# Patient Record
Sex: Female | Born: 1940 | Race: White | Hispanic: No | Marital: Married | Smoking: Former smoker
Health system: Southern US, Community
[De-identification: ages and names within clinical notes are randomized; demographics above are authoritative.]

## PROBLEM LIST (undated history)

## (undated) DIAGNOSIS — I493 Ventricular premature depolarization: Secondary | ICD-10-CM

## (undated) DIAGNOSIS — E785 Hyperlipidemia, unspecified: Secondary | ICD-10-CM

## (undated) DIAGNOSIS — I10 Essential (primary) hypertension: Secondary | ICD-10-CM

## (undated) DIAGNOSIS — I471 Supraventricular tachycardia: Secondary | ICD-10-CM

## (undated) DIAGNOSIS — R002 Palpitations: Secondary | ICD-10-CM

## (undated) DIAGNOSIS — H3530 Unspecified macular degeneration: Secondary | ICD-10-CM

## (undated) DIAGNOSIS — I350 Nonrheumatic aortic (valve) stenosis: Secondary | ICD-10-CM

## (undated) DIAGNOSIS — I499 Cardiac arrhythmia, unspecified: Secondary | ICD-10-CM

## (undated) HISTORY — DX: Ventricular premature depolarization: I49.3

## (undated) HISTORY — PX: TONSILLECTOMY: SUR1361

## (undated) HISTORY — DX: Nonrheumatic aortic (valve) stenosis: I35.0

## (undated) HISTORY — DX: Essential (primary) hypertension: I10

## (undated) HISTORY — DX: Hyperlipidemia, unspecified: E78.5

## (undated) HISTORY — PX: LAPAROSCOPY: SHX197

## (undated) HISTORY — PX: EYE SURGERY: SHX253

## (undated) HISTORY — DX: Cardiac arrhythmia, unspecified: I49.9

## (undated) HISTORY — DX: Palpitations: R00.2

## (undated) HISTORY — DX: Supraventricular tachycardia: I47.1

## (undated) HISTORY — PX: CHOLECYSTECTOMY: SHX55

---

## 1998-04-28 ENCOUNTER — Other Ambulatory Visit: Admission: RE | Admit: 1998-04-28 | Discharge: 1998-04-28 | Payer: Self-pay | Admitting: Obstetrics and Gynecology

## 1999-05-05 ENCOUNTER — Other Ambulatory Visit: Admission: RE | Admit: 1999-05-05 | Discharge: 1999-05-05 | Payer: Self-pay | Admitting: Obstetrics and Gynecology

## 2000-04-28 ENCOUNTER — Encounter: Payer: Self-pay | Admitting: Obstetrics and Gynecology

## 2000-04-28 ENCOUNTER — Encounter: Admission: RE | Admit: 2000-04-28 | Discharge: 2000-04-28 | Payer: Self-pay | Admitting: Obstetrics and Gynecology

## 2000-05-03 ENCOUNTER — Other Ambulatory Visit: Admission: RE | Admit: 2000-05-03 | Discharge: 2000-05-03 | Payer: Self-pay | Admitting: Obstetrics and Gynecology

## 2000-06-01 ENCOUNTER — Emergency Department (HOSPITAL_COMMUNITY): Admission: EM | Admit: 2000-06-01 | Discharge: 2000-06-01 | Payer: Self-pay | Admitting: Emergency Medicine

## 2000-06-01 ENCOUNTER — Encounter: Payer: Self-pay | Admitting: Emergency Medicine

## 2000-06-06 ENCOUNTER — Encounter: Payer: Self-pay | Admitting: General Surgery

## 2000-06-07 ENCOUNTER — Observation Stay (HOSPITAL_COMMUNITY): Admission: RE | Admit: 2000-06-07 | Discharge: 2000-06-08 | Payer: Self-pay | Admitting: General Surgery

## 2000-06-07 ENCOUNTER — Encounter (INDEPENDENT_AMBULATORY_CARE_PROVIDER_SITE_OTHER): Payer: Self-pay | Admitting: Specialist

## 2001-05-01 ENCOUNTER — Encounter: Payer: Self-pay | Admitting: Obstetrics and Gynecology

## 2001-05-01 ENCOUNTER — Encounter: Admission: RE | Admit: 2001-05-01 | Discharge: 2001-05-01 | Payer: Self-pay | Admitting: Obstetrics and Gynecology

## 2001-05-03 ENCOUNTER — Other Ambulatory Visit: Admission: RE | Admit: 2001-05-03 | Discharge: 2001-05-03 | Payer: Self-pay | Admitting: Obstetrics and Gynecology

## 2002-05-02 ENCOUNTER — Encounter: Admission: RE | Admit: 2002-05-02 | Discharge: 2002-05-02 | Payer: Self-pay | Admitting: Obstetrics and Gynecology

## 2002-05-02 ENCOUNTER — Encounter: Payer: Self-pay | Admitting: Obstetrics and Gynecology

## 2002-05-07 ENCOUNTER — Other Ambulatory Visit: Admission: RE | Admit: 2002-05-07 | Discharge: 2002-05-07 | Payer: Self-pay | Admitting: Obstetrics and Gynecology

## 2003-05-06 ENCOUNTER — Encounter: Payer: Self-pay | Admitting: Geriatric Medicine

## 2003-05-06 ENCOUNTER — Encounter: Admission: RE | Admit: 2003-05-06 | Discharge: 2003-05-06 | Payer: Self-pay | Admitting: Geriatric Medicine

## 2003-05-16 ENCOUNTER — Other Ambulatory Visit: Admission: RE | Admit: 2003-05-16 | Discharge: 2003-05-16 | Payer: Self-pay | Admitting: Obstetrics and Gynecology

## 2004-05-06 ENCOUNTER — Encounter: Admission: RE | Admit: 2004-05-06 | Discharge: 2004-05-06 | Payer: Self-pay | Admitting: Geriatric Medicine

## 2004-06-16 ENCOUNTER — Other Ambulatory Visit: Admission: RE | Admit: 2004-06-16 | Discharge: 2004-06-16 | Payer: Self-pay | Admitting: Obstetrics and Gynecology

## 2004-10-12 ENCOUNTER — Ambulatory Visit (HOSPITAL_COMMUNITY): Admission: RE | Admit: 2004-10-12 | Discharge: 2004-10-12 | Payer: Self-pay | Admitting: Gastroenterology

## 2005-06-17 ENCOUNTER — Other Ambulatory Visit: Admission: RE | Admit: 2005-06-17 | Discharge: 2005-06-17 | Payer: Self-pay | Admitting: Obstetrics and Gynecology

## 2005-07-02 ENCOUNTER — Encounter: Admission: RE | Admit: 2005-07-02 | Discharge: 2005-07-02 | Payer: Self-pay | Admitting: Obstetrics and Gynecology

## 2006-06-20 ENCOUNTER — Encounter: Admission: RE | Admit: 2006-06-20 | Discharge: 2006-06-20 | Payer: Self-pay | Admitting: Obstetrics and Gynecology

## 2007-06-22 ENCOUNTER — Encounter: Admission: RE | Admit: 2007-06-22 | Discharge: 2007-06-22 | Payer: Self-pay | Admitting: Obstetrics and Gynecology

## 2008-06-24 ENCOUNTER — Encounter: Admission: RE | Admit: 2008-06-24 | Discharge: 2008-06-24 | Payer: Self-pay | Admitting: Obstetrics and Gynecology

## 2009-07-08 ENCOUNTER — Encounter: Admission: RE | Admit: 2009-07-08 | Discharge: 2009-07-08 | Payer: Self-pay | Admitting: Obstetrics and Gynecology

## 2010-07-09 ENCOUNTER — Encounter: Admission: RE | Admit: 2010-07-09 | Discharge: 2010-07-09 | Payer: Self-pay | Admitting: Obstetrics and Gynecology

## 2010-11-29 ENCOUNTER — Encounter: Payer: Self-pay | Admitting: Obstetrics and Gynecology

## 2010-11-30 ENCOUNTER — Encounter: Payer: Self-pay | Admitting: Obstetrics and Gynecology

## 2011-03-05 ENCOUNTER — Other Ambulatory Visit: Payer: Self-pay | Admitting: Geriatric Medicine

## 2011-03-05 DIAGNOSIS — Z1231 Encounter for screening mammogram for malignant neoplasm of breast: Secondary | ICD-10-CM

## 2011-03-10 ENCOUNTER — Emergency Department (HOSPITAL_COMMUNITY): Payer: Medicare Other

## 2011-03-10 ENCOUNTER — Observation Stay (HOSPITAL_COMMUNITY)
Admission: EM | Admit: 2011-03-10 | Discharge: 2011-03-12 | Disposition: A | Discharge status: Home / Self Care | Payer: Medicare Other | Attending: Internal Medicine | Admitting: Internal Medicine

## 2011-03-10 DIAGNOSIS — Z79899 Other long term (current) drug therapy: Secondary | ICD-10-CM | POA: Insufficient documentation

## 2011-03-10 DIAGNOSIS — I10 Essential (primary) hypertension: Secondary | ICD-10-CM | POA: Insufficient documentation

## 2011-03-10 DIAGNOSIS — I700 Atherosclerosis of aorta: Secondary | ICD-10-CM | POA: Insufficient documentation

## 2011-03-10 DIAGNOSIS — R7301 Impaired fasting glucose: Secondary | ICD-10-CM | POA: Insufficient documentation

## 2011-03-10 DIAGNOSIS — E785 Hyperlipidemia, unspecified: Secondary | ICD-10-CM | POA: Insufficient documentation

## 2011-03-10 DIAGNOSIS — R079 Chest pain, unspecified: Principal | ICD-10-CM | POA: Insufficient documentation

## 2011-03-10 DIAGNOSIS — E876 Hypokalemia: Secondary | ICD-10-CM | POA: Insufficient documentation

## 2011-03-10 DIAGNOSIS — Z87891 Personal history of nicotine dependence: Secondary | ICD-10-CM | POA: Insufficient documentation

## 2011-03-10 LAB — BASIC METABOLIC PANEL
BUN: 14 mg/dL (ref 6–23)
CO2: 27 mEq/L (ref 19–32)
Chloride: 103 mEq/L (ref 96–112)
GFR calc non Af Amer: >60 mL/min (ref >60)
Glucose, Bld: 116 mg/dL — ABNORMAL HIGH (ref 70–99)
Potassium: 3.4 mEq/L — ABNORMAL LOW (ref 3.5–5.1)

## 2011-03-10 LAB — DIFFERENTIAL
Eosinophils Absolute: 0.1 10*3/uL (ref 0.0–0.7)
Eosinophils Relative: 1 % (ref 0–5)
Lymphs Abs: 1.9 10*3/uL (ref 0.7–4.0)
Monocytes Absolute: 0.4 10*3/uL (ref 0.1–1.0)

## 2011-03-10 LAB — CBC
MCHC: 34.6 g/dL (ref 30.0–36.0)
MCV: 89.5 fL (ref 78.0–100.0)
Platelets: 297 10*3/uL (ref 150–400)
RDW: 13.1 % (ref 11.5–15.5)
WBC: 9 10*3/uL (ref 4.0–10.5)

## 2011-03-10 LAB — URINALYSIS, ROUTINE W REFLEX MICROSCOPIC
Bilirubin Urine: NEGATIVE
Glucose, UA: NEGATIVE mg/dL
Hgb urine dipstick: NEGATIVE
Specific Gravity, Urine: 1.01 (ref 1.005–1.030)
pH: 6.5 (ref 5.0–8.0)

## 2011-03-10 LAB — POCT CARDIAC MARKERS
CKMB, poc: <1 ng/mL — ABNORMAL LOW (ref 1.0–8.0)
CKMB, poc: 1 ng/mL (ref 1.0–8.0)
Myoglobin, poc: 41.9 ng/mL (ref 12–200)

## 2011-03-11 LAB — COMPREHENSIVE METABOLIC PANEL
ALT: 28 U/L (ref 0–35)
Albumin: 3.7 g/dL (ref 3.5–5.2)
Alkaline Phosphatase: 89 U/L (ref 39–117)
Glucose, Bld: 118 mg/dL — ABNORMAL HIGH (ref 70–99)
Potassium: 3.4 mEq/L — ABNORMAL LOW (ref 3.5–5.1)
Sodium: 141 mEq/L (ref 135–145)
Total Protein: 6.5 g/dL (ref 6.0–8.3)

## 2011-03-11 LAB — CK TOTAL AND CKMB (NOT AT ARMC)
CK, MB: 1.8 ng/mL (ref 0.3–4.0)
CK, MB: 1.8 ng/mL (ref 0.3–4.0)
Relative Index: INVALID (ref 0.0–2.5)
Total CK: 72 U/L (ref 7–177)

## 2011-03-11 LAB — POCT I-STAT 3, VENOUS BLOOD GAS (G3P V)
pCO2, Ven: 40.3 mmHg — ABNORMAL LOW (ref 45.0–50.0)
pH, Ven: 7.34 — ABNORMAL HIGH (ref 7.250–7.300)
pO2, Ven: 37 mmHg (ref 30.0–45.0)

## 2011-03-11 LAB — POCT I-STAT 3, ART BLOOD GAS (G3+)
Bicarbonate: 24 mEq/L (ref 20.0–24.0)
TCO2: 25 mmol/L (ref 0–100)
pCO2 arterial: 39.7 mmHg (ref 35.0–45.0)
pH, Arterial: 7.39 (ref 7.350–7.400)

## 2011-03-11 LAB — TROPONIN I: Troponin I: <0.3 ng/mL (ref <0.30)

## 2011-03-11 LAB — MAGNESIUM: Magnesium: 2.1 mg/dL (ref 1.5–2.5)

## 2011-03-11 LAB — MRSA PCR SCREENING: MRSA by PCR: NEGATIVE

## 2011-03-11 LAB — CBC
HCT: 37.5 % (ref 36.0–46.0)
RDW: 13.3 % (ref 11.5–15.5)

## 2011-03-11 LAB — GLUCOSE, CAPILLARY: Glucose-Capillary: 121 mg/dL — ABNORMAL HIGH (ref 70–99)

## 2011-03-11 LAB — PROTIME-INR: INR: 0.92 (ref 0.00–1.49)

## 2011-03-11 LAB — LIPASE, BLOOD: Lipase: 25 U/L (ref 11–59)

## 2011-03-11 LAB — HEPATIC FUNCTION PANEL
Bilirubin, Direct: 0.1 mg/dL (ref 0.0–0.3)
Indirect Bilirubin: 0.3 mg/dL (ref 0.3–0.9)
Total Protein: 6.7 g/dL (ref 6.0–8.3)

## 2011-03-11 MED ORDER — IOHEXOL 300 MG/ML  SOLN
100.0000 mL | Freq: Once | INTRAMUSCULAR | Status: AC | PRN
  Administered 2011-03-11: 100 mL via INTRAVENOUS

## 2011-03-12 LAB — CBC
HCT: 34.2 % — ABNORMAL LOW (ref 36.0–46.0)
Hemoglobin: 11.5 g/dL — ABNORMAL LOW (ref 12.0–15.0)
MCHC: 33.6 g/dL (ref 30.0–36.0)
MCV: 89.8 fL (ref 78.0–100.0)
RDW: 13.4 % (ref 11.5–15.5)

## 2011-03-12 LAB — BASIC METABOLIC PANEL
BUN: 11 mg/dL (ref 6–23)
CO2: 27 mEq/L (ref 19–32)
Calcium: 9 mg/dL (ref 8.4–10.5)
Glucose, Bld: 125 mg/dL — ABNORMAL HIGH (ref 70–99)
Potassium: 3 mEq/L — ABNORMAL LOW (ref 3.5–5.1)
Sodium: 139 mEq/L (ref 135–145)

## 2011-03-12 LAB — HEMOGLOBIN A1C
Hgb A1c MFr Bld: 5.9 % — ABNORMAL HIGH (ref <5.7)
Mean Plasma Glucose: 123 mg/dL — ABNORMAL HIGH (ref <117)

## 2011-03-12 LAB — LIPID PANEL
HDL: 68 mg/dL (ref >39)
Triglycerides: 106 mg/dL (ref <150)
VLDL: 21 mg/dL (ref 0–40)

## 2011-03-13 NOTE — Discharge Summary (Signed)
[name], SHANDS                 ACCOUNT NO.:  [id number]  MEDICAL RECORD NO.:  [id number]           PATIENT TYPE:  O  LOCATION:  2005                         FACILITY:  [facility]  PHYSICIAN:  Pleas Koch, MD        DATE OF BIRTH:  1941-04-08  DATE OF ADMISSION:  03/10/2011 DATE OF DISCHARGE:                              DISCHARGE SUMMARY   DISCHARGE DIAGNOSES: 1. Chest pain likely a combination of possible costochondritis     secondary to lifting a heavy bag versus reflux. 2. Hypertension, well controlled. 3. Hyperlipidemia with LDL of 74. 4. Hypokalemia. 5. Impaired fasting glucose. 6. Former heavy tobacco use.  DISCHARGE MEDICATIONS: 1. Tylenol 2 tablets q.a.m. 325 mg. 2. Fish oil one cap over the counter daily. 3. Ambien 5 mg 1 tablet nightly. 4. New med, Coreg 3.125 b.i.d. with meals 59-month supply given. 5. Continue amlodipine and benazepril 5/20. 6. Felodipine has been discontinued 10 mg. 7. Simvastatin 20 mg p.o. nightly. 8. Multivitamin over the counter 1 tablet daily. 9. The patient is given a prescription of oxycodone 5/325 daily     p.r.n., 12 tablets. 10.Aspirin 81 mg. 11.calcium carbonate 1 tablet b.i.d. 12.Hydrochlorothiazide 25 one tablet daily. 13.Vitamin D3 p.o. 1 tablet daily.  HOSPITAL COURSE:  Briefly, the patient is a very pleasant 70 year old Caucasian female who presents to [facility] with chest pain. She went to primary care physician who prescribed Celebrex.  Despite taking which, she developed chest pressure, it was nonradiating.  It was noted that the patient had an EKG, which was worrisome as the primary care physician felt she was supposed to be scheduled for Cardiology consult.  Went back home, then developed sudden chest pressure, which was retrosternal, nonradiating, and it was noted be worse with exertion. No shortness breath, no nausea, no vomiting, no diarrhea.  Some radiation to her back.  PHYSICAL EXAMINATION:  VITAL SIGNS:   Blood pressure 179/78, pulse 88, temperature 98.2, respirations are 18. HEART:  Systolic murmur. GENERAL:  Alert and oriented. EXTREMITIES:  Upper and lower extremity with 5/5 power.  EKG initially showed ST-T wave changes.  Heart rate was 92 beats per minute.  WBC was 9, platelets are 297.  D-dimer was less than 0.2. Potassium of 3.4, BUN/creatinine ratio is 14/0.4.  First set of cardiac markers is negative.  PROBLEMS: 1. Chest pain.  The patient was kept on the Step-Down Unit initially     and seen by Dr. Verdis Prime of Cardiology.  The patient was kept on     IV nitro drip initially as Nitropaste dropped her pressure     slightly.  This ultimately resolved, and she did not need nitro     subsequently.  However, the patient underwent cardiac     catheterization, results of which are below.  Hemodynamic data     showed widely patent coronary arteries around to 20-30% narrowing     of proximal RCA.  Normal left ventricular systolic function, mild     aortic stenosis with valve area greater than 1.21 sq cm.  It was     thought  that no further cardiac cause could be evaluated.  It was     recommended to continue statin and aspirin therapy.  The patient     had an echocardiogram showing EF of 65-70% with possible bicuspid     AV valve and mild stenosis, mild regurgitation of the AV valve.     The patient will be followed up by Dr. Verdis Prime as per his     preference on discharge.  I am going to discharge the patient only     when seen by Cardiology.  They can make that call if the patient     needs to stay. 2. Impaired fasting glucose.  The patient states to me that she has     had slightly higher blood sugars than usual over 100.  I did get an     A1c, but that is pending, that will need followup in the outpatient     setting. 3. Lipid panel done in hospital showed LDL to be 74, which is     excellent.  She can continue on a dose of simvastatin. 4. Hypertension.  The patient will  continue on her lisinopril 10 and     also her   Dictation ended at this point.          ______________________________ Pleas Koch, MD     JS/MEDQ  D:  03/12/2011  T:  03/12/2011  Job:  [id number]  Electronically Signed by Pleas Koch MD on 03/13/2011 05:28:59 PM

## 2011-03-18 NOTE — Consult Note (Signed)
Whitehead, [name]                 ACCOUNT NO.:  [id number]  MEDICAL RECORD NO.:  [id number]           PATIENT TYPE:  LOCATION:                                 FACILITY:  PHYSICIAN:  Lyn Records, M.D.   DATE OF BIRTH:  Jan 31, 1941  DATE OF CONSULTATION: DATE OF DISCHARGE:                                CONSULTATION   REASON FOR CONSULTATION:  Chest discomfort.  CONCLUSIONS: 1. Prolonged precordial chest tightness, continuing through the night     despite IV nitroglycerin.  Cause is uncertain, but we must exclude     unstable angina pectoris.     a.     Coronary artery disease has been documented by CT angio      performed last night with heavy calcification in coronary      territories. 2. Aortic stenosis.     a.     Mild aortic stenosis by echo in September 2011 with peak      transaortic velocity of 2.6 meters/second and calculated aortic      valve area of 1.3 sq cm.     b.     Left ventricular ejection fraction 65%. 3. Hypertension. 4. Hyperlipidemia. 5. Former heavy cigarette smoker.  RECOMMENDATIONS: 1. Aspirin. 2. IV nitro 3. Statin therapy. 4. Left and right heart cath.  The procedure, the pros and cons, and     especially the risks were discussed with the patient in detail.     Risks include stroke, heart attack, death, bleeding,     nephrotoxicity, vascular ischemia, and among others were discussed     and accepted by the patient.  We also discussed the possibility of     PCI with stenting if appropriate and the risk to include the     possibility of emergency surgery.  COMMENTS:  The patient is 70 years of age and for approximately a week has had some vague chest discomfort.  This was initially felt to be costochondritis.  She had a complete exam yesterday with Dr. Merlene Laughter and again complained to him of the intermittent nature of the pain.  He became concerned and felt that she needed to have a nuclear perfusion study performed.  Later in the day on  yesterday, the discomfort became quite severe and was described as a pressure or tightness in the chest.  It was not made worse by physical activity. There was no dyspnea or diaphoresis.  Because of the severity of discomfort, she came to the emergency room where she was seen and admitted by the [facility].  She was given nitroglycerin paste and pain medication in the emergency room that caused hypotension, nausea, and vomiting.  She was ultimately admitted to the hospital, started on IV nitroglycerin, and is currently lying relatively comfortably in bed, although still having a residual mild precordial discomfort.  Prior to the initiation of the pain, a week or so ago, she has not really suffered with any chest discomfort.  SIGNIFICANT MEDICAL PROBLEMS: 1. Hyperlipidemia. 2. Hypertension. 3. Insomnia. 4. Fibroids. 5. Aortic stenosis. 6. Macular degeneration.  ALLERGIES:  CODEINE.  PAST  SURGICAL HISTORY:  Laparoscopic cholecystectomy and laparoscopic GYN procedure in 2001.  FAMILY HISTORY:  Father died at age 33 of COPD.  Mother died at age 3 of cancer.  Three sisters are alive and well.  Echocardiogram performed in September 2011 and interpreted by Dr. Everette Rank, revealed mild aortic stenosis, LVEF 65%, and otherwise unremarkable.  PHYSICAL EXAMINATION:  GENERAL:  The patient is in no acute distress. VITAL SIGNS:  Blood pressure is 140/70.  Heart rate is 90.  Monitor reveals normal sinus rhythm. NECK:  Carotid upstroke is 2+ bilaterally.  Transmitted systolic bruits are heard bilaterally. CARDIAC:  There is an S4 gallop.  She has grade 3/6 crescendo- decrescendo systolic murmur of aortic stenosis.  No diastolic murmurs heard.  I am unable to appreciate a pericardial friction rub. LUNGS:  Clear to auscultation and percussion. ABDOMEN:  Faint bruit. EXTREMITIES:  No edema.  Radial, femoral, and posterior tibial pulses are 2 to 3+ bilaterally. NEURO:   Intact.  The laboratory data currently present includes a chest x-ray that shows no active disease.  A CT scan demonstrates no pulmonary emboli.  There is evidence of interstitial edema and mild CHF.  There is evidence of coronary calcification.  Laboratory data reveals a hemoglobin of 12.7.  White blood cell count of 9000, creatinine of 0.47, and potassium 3.4.  Normal liver tests. Mildly elevated blood sugar at 118.  Two sets of cardiac markers, one of which was point-of-care are negative.  EKG done last evening reveals nonspecific ST-T wave abnormality.  DISCUSSION:  I am suspicious that this represents unstable angina. There is no objective data to substantiate this.  Coronary angiography will be the most direct way to assess this patient who has risk factors, coronary artery disease by CT angio, and a concerning chest pain history.     Lyn Records, M.D.     HWS/MEDQ  D:  03/11/2011  T:  03/11/2011  Job:  [id number]  cc:   Hal T. Stoneking, M.D.  Electronically Signed by Verdis Prime M.D. on 03/18/2011 12:21:47 PM

## 2011-03-18 NOTE — Cardiovascular Report (Signed)
NAMECLEORA, [name]                 ACCOUNT NO.:  [id number]  MEDICAL RECORD NO.:  [id number]           PATIENT TYPE:  O  LOCATION:  2005                         FACILITY:  [facility]  PHYSICIAN:  Lyn Records, M.D.   DATE OF BIRTH:  Nov 25, 1940  DATE OF PROCEDURE:  03/11/2011 DATE OF DISCHARGE:                           CARDIAC CATHETERIZATION   INDICATIONS:  Prolonged chest pain, progressive over 2 weeks of intermittent nature.  CT scan done to rule out pulmonary emboli demonstrated calcification in the coronary arteries.  There is also a history of aortic stenosis and a clinically significant systolic murmur.  PROCEDURES PERFORMED: 1. Left and right heart catheterization. 2. Coronary angiography. 3. Left ventriculography.  DESCRIPTION OF PROCEDURE:  After informed consent, an arterial sheath, 5- [place], was placed in the right femoral artery and a 6-French sheath in the right femoral vein.  The modified Seldinger technique was used.  A 2 mg of Versed and 100 mcg of fentanyl was administered.  A Swan-Ganz catheter was then used to measure hemodynamics in the right heart and thermodilution cardiac outputs were performed.  The cardiac output was 5.3 liters per minute.  We used an A2 multipurpose catheter, 5-French to perform left heart catheterization.  This catheter was used for LV opacification by hand injection, and selective left and right coronary angiography.  The patient tolerated the procedure without complications.  Angio-Seal was used for hemostasis on the right femoral artery puncture site.  Manual compression was used for hemostasis on the right femoral vein.  No complications occurred.  RESULTS: 1. Hemodynamic data:     a.     Right atrial mean pressure 7 mmHg.     b.     Right ventricular pressure 37/9 mmHg.     c.     Pulmonary artery pressure 34/15 mmHg.     d.     Pulmonary capillary wedge pressure 17 mmHg.     e.     Thermodilution cardiac output 5.3 liters  per minute.     f.     LV pressure 170/19 mmHg.     g.     Aortic pressure 146/68 mmHg.     h.     Peak-to-peak aortic valve gradient 24 mmHg.     i.     Calculated aortic valve area 1.2 cm2. 2. Left ventriculography:  Left ventricle is hyperdynamic.  EF is     greater than 65-70%.  No wall motion abnormality is noted. 3. Coronary angiography.     a.     Left main coronary:  Widely patent.     b.     Left anterior descending coronary:  The vessel is large.  It      gives origin to a large diagonal.  Two smaller diagonals are also      noted.  No obstructive lesions were seen in the left anterior      descending distribution.     c.     Circumflex artery:  A moderate-sized first obtuse marginal      and a larger branching distal obtuse marginal arise  from this      vessel.  The vessel is normal.     d.     Right coronary:  The right coronary artery is normal with      the exception of irregularities and up to 30% narrowing in the      proximal vessel.  The PDA is a branching twin system.  A moderate-      sized left ventricular branch arises from the distal circumflex.      No abnormalities are noted within the right coronary distribution.      The vessel is widely patent.  CONCLUSIONS: 1. Widely patent coronary arteries with only 20-30% narrowing in the     proximal RCA. 2. Normal left ventricular function. 3. Mild aortic stenosis with valve area greater than 1.21 cm square. 4. Chest pain of uncertain cause.  Pericarditis has not been excluded,     but there is no clinical findings to suggest its presence.  The     chest pain clearly does not appear to be related to ischemia or     demand from coronary disease or severe AS respectively.  PLAN:  No further cardiac evaluation.  We will listen closely for evidence of pericardial friction rub.  Consider GI sources.     Lyn Records, M.D.     HWS/MEDQ  D:  03/11/2011  T:  03/12/2011  Job:  [id number]  cc:   Hal T. Stoneking,  M.D.  Electronically Signed by Verdis Prime M.D. on 03/18/2011 12:21:44 PM

## 2011-03-21 NOTE — H&P (Signed)
[name], [name]                 ACCOUNT NO.:  [id number]  MEDICAL RECORD NO.:  [id number]           PATIENT TYPE:  E  LOCATION:  [facility]                         FACILITY:  [facility]  PHYSICIAN:  Eduard Clos, MDDATE OF BIRTH:  1941/10/30  DATE OF ADMISSION:  03/10/2011 DATE OF DISCHARGE:                             HISTORY & PHYSICAL   PRIMARY CARE PHYSICIAN:  Hal T. Stoneking, MD  CHIEF COMPLAINT:  Chest pain.  HISTORY OF PRESENT ILLNESS:  A 70 year old female with a history of hypertension, hyperlipidemia and systolic heart murmur, possibly from aortic stenosis has been experiencing some chest pain.  The patient experienced chest pain almost a week ago when the patient had gone to the primary care physician who prescribed a Celebrex, despite taking which the patient today morning when she woke up she developed some chest pressure, nonradiating and she had gone to the PCP who had arranged Cardiology consult this Friday, then she went back home and she went to shopping later in the evening when she suddenly developed chest pressure, retrosternal, nonradiating, noted with exertion.  Do not have any shortness of breath, nausea, vomiting or diarrhea.  At this time, the chest pain is mildly radiating to her back.  The patient has been admitted for further workup.  The patient denies any nausea, vomiting, abdominal pain, dysuria, discharge, diarrhea.  Denies any fever, chills, cough.  Denies any headache, visual symptoms, dizziness or loss of consciousness or focal deficit.  PAST MEDICAL HISTORY: 1. Hypertension. 2. Hyperlipidemia. 3. Systolic heart murmur probably from aortic stenosis.  PAST SURGICAL HISTORY:  Has had knee arthroscopy.  MEDICATIONS PRIOR TO ADMISSION:  Hydrochlorothiazide and lisinopril dose not known, simvastatin dose not known and was recently started on Celebrex.  ALLERGIES:  CODEINE.  SOCIAL HISTORY:  The patient is married, lives with her husband,  is a full code.  Does not smoke cigarettes, drinks wine at least once or twice a week.  Denies any drug abuse.  FAMILY HISTORY:  Significant for stroke and heart attack in her mother who had a CABG done.  REVIEW OF SYSTEMS:  As per history of present illness nothing else significant.  PHYSICAL EXAMINATION:  GENERAL:  The patient examined at bedside not in acute distress. VITAL SIGNS:  Blood pressure 179/70, pulse is 88 permanent, temperature 98.2, respirations 18, O2 sat 99%. HEENT:  Anicteric.  No pallor.  No discharge from ears, eyes, nose or mouth. CHEST:  Bilateral air entry present.  No rhonchi, no crepitation. HEART:  S1, S2 heard.  Systolic murmur. ABDOMEN:  Soft, nontender.  Bowel sounds heard. CNS:  The patient alert, awake and oriented to time, place and person. Moves upper and lower extremities 5/5. EXTREMITIES:  Peripheral pulses felt.  No edema.  LABORATORY DATA:  EKG shows normal sinus rhythm with nonspecific ST-T changes.  Heart rate is around 92 beats per minute.  Chest x-ray show no active disease.  CBC WBC 9, hemoglobin 12.4, hematocrit 35.8, platelets 297, D-dimer is less than 0.2.  Basic metabolic panel sodium 140, potassium 3.4, chloride 103, carbon dioxide 27, glucose 116, BUN 14, creatinine 0.4,  calcium 9.5, CK-MB is 1, troponin-I less than 0.05, myoglobin is 41.9.  UA is negative.  ASSESSMENT: 1. Chest pain rule out acute coronary syndrome. 2. Uncontrolled hypertension. 3. History of systolic heart murmur possibly from aortic stenosis. 4. Hyperlipidemia.  PLAN: 1. At this time, admit the patient to telemetry. 2. For chest pain, the patient to be on nitroglycerin paste, aspirin,     cycle cardiac markers, get 2-D echo.  At this time, there is a     concern for angina and with history of possible aortic stenosis     keep the patient n.p.o. past midnight and get a Cardiology consult     in case the patient would need further intervention during this      stay. 3. We need to find her home medications. 4. Hypertension control.  I am going to place the patient on     lisinopril 10 mg one dose now which can be changed to home dose     once again to regular home medication.  At this time, also the     patient is on IV labetalol p.r.n. for uncontrolled blood pressure. 5. Further recommendation as condition evolves.     Eduard Clos, MD     ANK/MEDQ  D:  03/10/2011  T:  03/10/2011  Job:  [id number]  cc:   Hal T. Stoneking, M.D.  Electronically Signed by Midge Minium MD on 03/21/2011 08:30:37 AM

## 2011-03-26 NOTE — Op Note (Signed)
[name], [name]                 ACCOUNT NO.:  [id number]   MEDICAL RECORD NO.:  [id number]          PATIENT TYPE:  AMB   LOCATION:  ENDO                         FACILITY:  [facility]   PHYSICIAN:  Danise Edge, M.D.   DATE OF BIRTH:  30-Oct-1941   DATE OF PROCEDURE:  10/12/2004  DATE OF DISCHARGE:                                 OPERATIVE REPORT   PROCEDURE:  Colonoscopy.   INDICATIONS FOR PROCEDURE:  Ms. Yasemin Rabon is a 70 year old female born  [birth year].  Ms. Sax is scheduled to undergo a screening  colonoscopy with polypectomy to prevent colon cancer.   ENDOSCOPIST:  Danise Edge, M.D.   PREMEDICATION:  Versed 10 mg, Demerol 100 mg.   PROCEDURE:  After obtaining informed consent, Ms. Gradel was placed in the  left lateral decubitus position.  I administered intravenous Demerol and  intravenous Versed to achieve conscious sedation for the procedure.  The  patient's blood pressure, oxygen saturation, and cardiac rhythm were  monitored throughout the procedure and documented in the medical record.   Anal inspection and digital rectal exam was normal.  The Olympus adjustable  pediatric colonoscope was introduced into the rectum and advanced to the  cecum.  Colonic preparation for the exam today was excellent.   RECTUM:  Normal.   SIGMOID COLON/DESCENDING COLON:  Left colonic diverticulosis.   SPLENIC FLEXURE:  Normal.   TRANSVERSE COLON:  Normal.   HEPATIC FLEXURE:  Normal.   ASCENDING COLON:  Normal.   CECUM AND ILEOCECAL VALVE:  Normal.   ASSESSMENT:  Normal screening proctocolonoscopy to the cecum.  Left colonic  diverticulosis noted.  No endoscopic evidence for the presence of colorectal  neoplasia.      MJ/MEDQ  D:  10/12/2004  T:  10/12/2004  Job:  [id number]   cc:   Hal T. Stoneking, M.D.  [address] [place], [place] [address]  Fax: [phone]

## 2011-03-26 NOTE — Op Note (Signed)
[place]  Patient:    [name], [name]            MRN: [id number] Proc. Date: 06/07/00 Adm. Date:  [id number] Disc. Date: [id number] Attending:  Tempie Donning CC:         Hal T. Stoneking, M.D.   Operative Report  OPERATIVE PROCEDURE:  Laparoscopic cholecystectomy.  SURGEON:  Gita Kudo, M.D.  ANESTHESIA:  General.  PREOPERATIVE DIAGNOSIS:  Cholecystitis.  POSTOPERATIVE DIAGNOSIS:  Cholecystitis.  CLINICAL SUMMARY:  A 70 year old female brought in for elective cholecystectomy.  She had severe attacks of right upper quadrant abdominal pain requiring visits to the emergency room.  Gallbladder ultrasound shows multiple stones, and her liver function studies are normal.  She has a positive family history.  OPERATIVE FINDINGS:  The gallbladder was long and packed with small stones. The cystic duct was normal in size and anatomy, as was the artery.  There was no acute infection or inflammation.  OPERATIVE PROCEDURE:  Under satisfactory general endotracheal anesthesia, having received 1 g Ancef preop, the patients abdomen was prepped and draped in a standard fashion.  She had a small umbilical hernia that was repaired with a closure.  A transverse incision made below the umbilicus, and the hernia dissected around the healthy fascia, and the sac reduced.  The peritoneum was then opened.  Following this, the fascia was controlled with a figure-of-eight 0 Vicryl suture, and a Hasson and operating port inserted and secured.  A satisfactory CO2 pneumoperitoneum was established, and under direct vision camera placed, and two #5 ports placed laterally, and one #10 port medially through skin sites that were infiltrated with Marcaine.  With good exposure using graspers through the lateral port, I operated through the medial port.  The gallbladder cystic duct junction was carefully identified, and the cystic duct dissected  circumferentially using a right angled clamp. When we were certain of the anatomy, it was controlled with multiple metal clips and divided between the distal two.  A similar dissection was used for the cystic artery.  Following this, the gallbladder was removed from below upwards using the coagulating spatula for hemostasis and dissection.  After amputating it from the liver, the liver bed was checked for hemostasis and made dry by cautery, and then suctioned and lavaged with saline.  Because of concern of possibly spilling his multiple stones, an Endocatch bag was placed in through the upper port, and gallbladder placed in it, and it was secured.  Then the camera was moved to the upper port, and the large grasper placed in the lower umbilical port, and used to retrieve the gallbladder intact and without spillage or complication.  Following this the operative sites were checked for hemostasis, again lavaged with saline and suctioned dry.  The ports were removed under direct vision, and then the CO2 released.  The umbilical defect was then closed in a transverse direction with four interrupted 0 figure-of-eight Prolene suture.  Subcutaneous was then infiltrated with Marcaine, and closed with 4-0 Vicryl, and then all skin sites approximated with Steri-Strips.  Sterile absorbent dressings were applied. Sponge and needle counts were correct.  The patient then went to the recovery room from the operating room in good condition. DD:  06/07/00 TD:  06/08/00 Job: [id number]

## 2011-07-14 ENCOUNTER — Ambulatory Visit
Admission: RE | Admit: 2011-07-14 | Discharge: 2011-07-14 | Disposition: A | Discharge status: Home / Self Care | Payer: Medicare Other | Source: Ambulatory Visit | Attending: Geriatric Medicine | Admitting: Geriatric Medicine

## 2011-07-14 DIAGNOSIS — Z1231 Encounter for screening mammogram for malignant neoplasm of breast: Secondary | ICD-10-CM

## 2012-03-17 ENCOUNTER — Other Ambulatory Visit: Payer: Self-pay | Admitting: Obstetrics and Gynecology

## 2012-03-17 DIAGNOSIS — Z1231 Encounter for screening mammogram for malignant neoplasm of breast: Secondary | ICD-10-CM

## 2012-07-17 ENCOUNTER — Ambulatory Visit
Admission: RE | Admit: 2012-07-17 | Discharge: 2012-07-17 | Disposition: A | Discharge status: Home / Self Care | Payer: Medicare Other | Source: Ambulatory Visit | Attending: Obstetrics and Gynecology | Admitting: Obstetrics and Gynecology

## 2012-07-17 DIAGNOSIS — Z1231 Encounter for screening mammogram for malignant neoplasm of breast: Secondary | ICD-10-CM

## 2012-07-25 ENCOUNTER — Other Ambulatory Visit: Payer: Self-pay | Admitting: Obstetrics and Gynecology

## 2012-07-25 DIAGNOSIS — R928 Other abnormal and inconclusive findings on diagnostic imaging of breast: Secondary | ICD-10-CM

## 2012-07-26 ENCOUNTER — Ambulatory Visit
Admission: RE | Admit: 2012-07-26 | Discharge: 2012-07-26 | Disposition: A | Discharge status: Home / Self Care | Payer: Medicare Other | Source: Ambulatory Visit | Attending: Obstetrics and Gynecology | Admitting: Obstetrics and Gynecology

## 2012-07-26 DIAGNOSIS — R928 Other abnormal and inconclusive findings on diagnostic imaging of breast: Secondary | ICD-10-CM

## 2013-04-17 ENCOUNTER — Other Ambulatory Visit: Payer: Self-pay

## 2013-04-17 DIAGNOSIS — Z1231 Encounter for screening mammogram for malignant neoplasm of breast: Secondary | ICD-10-CM

## 2013-07-18 ENCOUNTER — Ambulatory Visit
Admission: RE | Admit: 2013-07-18 | Discharge: 2013-07-18 | Disposition: A | Discharge status: Home / Self Care | Payer: Medicare Other | Source: Ambulatory Visit

## 2013-07-18 DIAGNOSIS — Z1231 Encounter for screening mammogram for malignant neoplasm of breast: Secondary | ICD-10-CM

## 2013-09-05 ENCOUNTER — Encounter (HOSPITAL_COMMUNITY): Payer: Self-pay | Admitting: Interventional Cardiology

## 2013-09-05 ENCOUNTER — Telehealth: Payer: Self-pay

## 2013-09-05 NOTE — Telephone Encounter (Signed)
pt aware of echo resultsLeft ventricular size and function are normal. There is mild to moderate aortic and mitral regurgitation. No significant or concerning abnormalities noted. pt verbalized understanding

## 2013-09-05 NOTE — Telephone Encounter (Signed)
Message copied by Jarvis Newcomer on Wed Sep 05, 2013  3:48 PM ------      Message from: Verdis Prime      Created: Wed Sep 05, 2013  2:48 PM       Left ventricular size and function are normal. There is mild to moderate aortic and mitral regurgitation. No significant or concerning abnormalities noted. ------

## 2014-02-25 ENCOUNTER — Other Ambulatory Visit: Payer: Self-pay | Admitting: Gastroenterology

## 2014-04-15 ENCOUNTER — Other Ambulatory Visit: Payer: Self-pay

## 2014-04-15 DIAGNOSIS — Z1231 Encounter for screening mammogram for malignant neoplasm of breast: Secondary | ICD-10-CM

## 2014-04-17 ENCOUNTER — Encounter (HOSPITAL_COMMUNITY): Payer: Self-pay | Admitting: Pharmacy Technician

## 2014-04-24 ENCOUNTER — Encounter (HOSPITAL_COMMUNITY): Payer: Self-pay | Admitting: *Deleted

## 2014-05-07 ENCOUNTER — Encounter (HOSPITAL_COMMUNITY): Payer: Medicare Other | Admitting: Anesthesiology

## 2014-05-07 ENCOUNTER — Ambulatory Visit (HOSPITAL_COMMUNITY): Payer: Medicare Other | Admitting: Anesthesiology

## 2014-05-07 ENCOUNTER — Encounter (HOSPITAL_COMMUNITY)
Admission: RE | Disposition: A | Discharge status: Home / Self Care | Payer: Self-pay | Source: Ambulatory Visit | Attending: Gastroenterology

## 2014-05-07 ENCOUNTER — Encounter (HOSPITAL_COMMUNITY): Payer: Self-pay | Admitting: *Deleted

## 2014-05-07 ENCOUNTER — Ambulatory Visit (HOSPITAL_COMMUNITY)
Admission: RE | Admit: 2014-05-07 | Discharge: 2014-05-07 | Disposition: A | Discharge status: Home / Self Care | Payer: Medicare Other | Source: Ambulatory Visit | Attending: Gastroenterology | Admitting: Gastroenterology

## 2014-05-07 DIAGNOSIS — Z79899 Other long term (current) drug therapy: Secondary | ICD-10-CM | POA: Insufficient documentation

## 2014-05-07 DIAGNOSIS — Z87891 Personal history of nicotine dependence: Secondary | ICD-10-CM | POA: Insufficient documentation

## 2014-05-07 DIAGNOSIS — Z1211 Encounter for screening for malignant neoplasm of colon: Secondary | ICD-10-CM | POA: Insufficient documentation

## 2014-05-07 DIAGNOSIS — K5730 Diverticulosis of large intestine without perforation or abscess without bleeding: Secondary | ICD-10-CM | POA: Insufficient documentation

## 2014-05-07 DIAGNOSIS — I080 Rheumatic disorders of both mitral and aortic valves: Secondary | ICD-10-CM | POA: Insufficient documentation

## 2014-05-07 DIAGNOSIS — I10 Essential (primary) hypertension: Secondary | ICD-10-CM | POA: Insufficient documentation

## 2014-05-07 HISTORY — DX: Unspecified macular degeneration: H35.30

## 2014-05-07 HISTORY — DX: Essential (primary) hypertension: I10

## 2014-05-07 HISTORY — PX: COLONOSCOPY WITH PROPOFOL: SHX5780

## 2014-05-07 SURGERY — COLONOSCOPY WITH PROPOFOL
Anesthesia: Monitor Anesthesia Care

## 2014-05-07 MED ORDER — ONDANSETRON HCL 4 MG/2ML IJ SOLN
INTRAMUSCULAR | Status: DC | PRN
Start: 2014-05-07 — End: 2014-05-07
  Administered 2014-05-07 (×2): 2 mg via INTRAVENOUS

## 2014-05-07 MED ORDER — MIDAZOLAM HCL 5 MG/5ML IJ SOLN: INTRAMUSCULAR | Status: DC | PRN

## 2014-05-07 MED ORDER — FENTANYL CITRATE 0.05 MG/ML IJ SOLN
INTRAMUSCULAR | Status: DC | PRN
  Administered 2014-05-07: 50 ug via INTRAVENOUS

## 2014-05-07 MED ORDER — PROPOFOL INFUSION 10 MG/ML OPTIME
INTRAVENOUS | Status: DC | PRN
  Administered 2014-05-07: 100 ug/kg/min via INTRAVENOUS

## 2014-05-07 MED ORDER — KETAMINE HCL 10 MG/ML IJ SOLN
INTRAMUSCULAR | Status: DC | PRN
  Administered 2014-05-07: 10 mg via INTRAVENOUS
  Administered 2014-05-07: 15 mg via INTRAVENOUS

## 2014-05-07 MED ORDER — LACTATED RINGERS IV SOLN
INTRAVENOUS | Status: DC
  Administered 2014-05-07: 10:00:00 via INTRAVENOUS
  Administered 2014-05-07: 1000 mL via INTRAVENOUS

## 2014-05-07 MED ORDER — SODIUM CHLORIDE 0.9 % IV SOLN: INTRAVENOUS | Status: DC

## 2014-05-07 SURGICAL SUPPLY — 21 items

## 2014-05-07 NOTE — Anesthesia Postprocedure Evaluation (Signed)
Anesthesia Post Note  Patient: [name]  Procedure(s) Performed: Procedure(s) (LRB): COLONOSCOPY WITH PROPOFOL (N/A)  Anesthesia type: MAC  Patient location: PACU  Post pain: Pain level controlled  Post assessment: Post-op Vital signs reviewed  Last Vitals: BP 138/53  Pulse 74  Temp(Src) 36.6 C (Oral)  Resp 16  Ht 5\' 5"  (1.651 m)  Wt 165 lb (74.844 kg)  BMI 27.46 kg/m2  SpO2 98%  Post vital signs: Reviewed  Level of consciousness: awake  Complications: No apparent anesthesia complications

## 2014-05-07 NOTE — Op Note (Signed)
Procedure: Screening colonoscopy. Normal screening colonoscopy performed 10/21/2004  Endoscopist: Earle Gell  Premedication: Propofol administered by anesthesia  Procedure: The patient was placed in the left lateral decubitus position. Anal inspection and digital rectal exam were normal. The Pentax pediatric colonoscope was introduced into the rectum and advanced to the cecum. A normal-appearing appendiceal orifice and ileocecal valve were identified. Colonic preparation for the exam today was good. Performance of today's colonoscopy was technically very difficult due to colonic loop formation.  Rectum. Normal. Retroflexed view of the distal rectum normal  Sigmoid colon and descending colon. Colonic diverticulosis  Splenic flexure. Normal  Transverse colon. Normal  Hepatic flexure. Normal  Ascending colon. Normal  Cecum and ileocecal valve. Normal  Assessment: Normal screening colonoscopy

## 2014-05-07 NOTE — Discharge Instructions (Signed)
Colonoscopy, Care After °These instructions give you information on caring for yourself after your procedure. Your doctor may also give you more specific instructions. Call your doctor if you have any problems or questions after your procedure. °HOME CARE °· Do not drive for 24 hours. °· Do not sign important papers or use machinery for 24 hours. °· You may shower. °· You may go back to your usual activities, but go slower for the first 24 hours. °· Take rest breaks often during the first 24 hours. °· Walk around or use warm packs on your belly (abdomen) if you have belly cramping or gas. °· Drink enough fluids to keep your pee (urine) clear or pale yellow. °· Resume your normal diet. Avoid heavy or fried foods. °· Avoid drinking alcohol for 24 hours or as told by your doctor. °· Only take medicines as told by your doctor. °If a tissue sample (biopsy) was taken during the procedure:  °· Do not take aspirin or blood thinners for 7 days, or as told by your doctor. °· Do not drink alcohol for 7 days, or as told by your doctor. °· Eat soft foods for the first 24 hours. °GET HELP IF: °You still have a small amount of blood in your poop (stool) 2-3 days after the procedure. °GET HELP RIGHT AWAY IF: °· You have more than a small amount of blood in your poop. °· You see clumps of tissue (blood clots) in your poop. °· Your belly is puffy (swollen). °· You feel sick to your stomach (nauseous) or throw up (vomit). °· You have a fever. °· You have belly pain that gets worse and medicine does not help. °MAKE SURE YOU: °· Understand these instructions. °· Will watch your condition. °· Will get help right away if you are not doing well or get worse. °Document Released: 11/27/2010 Document Revised: 10/30/2013 Document Reviewed: 07/02/2013 °ExitCare® Patient Information ©2015 [employer]. This information is not intended to replace advice given to you by your health care provider. Make sure you discuss any questions you have with  your health care provider. ° °Monitored Anesthesia Care  °Monitored anesthesia care is an anesthesia service for a medical procedure. Anesthesia is the loss of the ability to feel pain. It is produced by medications called anesthetics. It may affect a small area of your body (local anesthesia), a large area of your body (regional anesthesia), or your entire body (general anesthesia). The need for monitored anesthesia care depends your procedure, your condition, and the potential need for regional or general anesthesia. It is often provided during procedures where:  °· General anesthesia may be needed if there are complications. This is because you need special care when you are under general anesthesia.   °· You will be under local or regional anesthesia. This is so that you are able to have higher levels of anesthesia if needed.   °· You will receive calming medications (sedatives). This is especially the case if sedatives are given to put you in a semi-conscious state of relaxation (deep sedation). This is because the amount of sedative needed to produce this state can be hard to predict. Too much of a sedative can produce general anesthesia. °Monitored anesthesia care is performed by one or more caregivers who have special training in all types of anesthesia. You will need to meet with these caregivers before your procedure. During this meeting, they will ask you about your medical history. They will also give you instructions to follow. (For example, you   will need to stop eating and drinking before your procedure. You may also need to stop or change medications you are taking.) During your procedure, your caregivers will stay with you. They will:  °· Watch your condition. This includes watching you blood pressure, breathing, and level of pain.   °· Diagnose and treat problems that occur.   °· Give medications if they are needed. These may include calming medications (sedatives) and anesthetics.   °· Make sure  you are comfortable.   °Having monitored anesthesia care does not necessarily mean that you will be under anesthesia. It does mean that your caregivers will be able to manage anesthesia if you need it or if it occurs. It also means that you will be able to have a different type of anesthesia than you are having if you need it. When your procedure is complete, your caregivers will continue to watch your condition. They will make sure any medications wear off before you are allowed to go home.  °Document Released: 07/21/2005 Document Revised: 02/19/2013 Document Reviewed: 12/06/2012 °ExitCare® Patient Information ©2015 [employer]. This information is not intended to replace advice given to you by your health care provider. Make sure you discuss any questions you have with your health care provider. ° ° °

## 2014-05-07 NOTE — Transfer of Care (Signed)
Immediate Anesthesia Transfer of Care Note  Patient: [name]  Procedure(s) Performed: Procedure(s) (LRB): COLONOSCOPY WITH PROPOFOL (N/A)  Patient Location: PACU  Anesthesia Type: MAC  Level of Consciousness: sedated, patient cooperative and responds to stimulation  Airway & Oxygen Therapy: Patient Spontanous Breathing and Patient connected to face mask oxgen  Post-op Assessment: Report given to PACU RN and Post -op Vital signs reviewed and stable  Post vital signs: Reviewed and stable  Complications: No apparent anesthesia complications

## 2014-05-07 NOTE — H&P (Signed)
  Procedure: Screening colonoscopy. Normal screening colonoscopy performed 10/21/2004.  History: The patient is a 73 year old female born Apr 26, 1941. She is scheduled to undergo a repeat screening colonoscopy with polypectomy to prevent colon cancer.  Past medical history: Right cataract surgery. Laparoscopic cholecystectomy. Laparoscopic infertility procedure. Normal cardiac catheterization in 2012. Hypertension. Uterine fibroids. Mild aortic valve insufficiency and mitral regurgitation. Colonic diverticulosis. Hypercholesterolemia. Macular degeneration.  Exam: The patient is alert and lying comfortably on the endoscopy stretcher. Abdomen is soft and nontender to palpation. Lungs are clear to auscultation. Cardiac exam reveals a regular rhythm  Plan: Proceed with screening colonoscopy

## 2014-05-07 NOTE — Anesthesia Preprocedure Evaluation (Addendum)
Anesthesia Evaluation  Patient identified by MRN, date of birth, ID band Patient awake    Reviewed: Allergy & Precautions, H&P , NPO status , Patient's Chart, lab work & pertinent test results  Airway Mallampati: II TM Distance: >3 FB Neck ROM: Full    Dental  (+) Dental Advisory Given   Pulmonary former smoker,  breath sounds clear to auscultation        Cardiovascular hypertension, Pt. on medications + Valvular Problems/Murmurs MR, AI and AS Rhythm:Regular Rate:Normal  Mild to mod aortic stenosis   Neuro/Psych negative neurological ROS  negative psych ROS   GI/Hepatic negative GI ROS, Neg liver ROS,   Endo/Other  negative endocrine ROS  Renal/GU negative Renal ROS     Musculoskeletal negative musculoskeletal ROS (+)   Abdominal   Peds  Hematology negative hematology ROS (+)   Anesthesia Other Findings   Reproductive/Obstetrics negative OB ROS                          Anesthesia Physical Anesthesia Plan  ASA: III  Anesthesia Plan: MAC   Post-op Pain Management:    Induction: Intravenous  Airway Management Planned:   Additional Equipment:   Intra-op Plan:   Post-operative Plan:   Informed Consent: I have reviewed the patients History and Physical, chart, labs and discussed the procedure including the risks, benefits and alternatives for the proposed anesthesia with the patient or authorized representative who has indicated his/her understanding and acceptance.   Dental advisory given  Plan Discussed with: CRNA  Anesthesia Plan Comments:         Anesthesia Quick Evaluation

## 2014-05-08 ENCOUNTER — Encounter (HOSPITAL_COMMUNITY): Payer: Self-pay | Admitting: Gastroenterology

## 2014-07-04 ENCOUNTER — Encounter: Payer: Self-pay | Admitting: *Deleted

## 2014-07-19 ENCOUNTER — Ambulatory Visit
Admission: RE | Admit: 2014-07-19 | Discharge: 2014-07-19 | Disposition: A | Discharge status: Home / Self Care | Payer: Medicare Other | Source: Ambulatory Visit

## 2014-07-19 DIAGNOSIS — Z1231 Encounter for screening mammogram for malignant neoplasm of breast: Secondary | ICD-10-CM

## 2015-05-02 ENCOUNTER — Other Ambulatory Visit (HOSPITAL_COMMUNITY): Payer: BC Managed Care – PPO

## 2015-05-05 ENCOUNTER — Other Ambulatory Visit (HOSPITAL_COMMUNITY): Payer: Self-pay | Admitting: Geriatric Medicine

## 2015-05-05 DIAGNOSIS — I350 Nonrheumatic aortic (valve) stenosis: Secondary | ICD-10-CM

## 2015-05-06 ENCOUNTER — Ambulatory Visit (HOSPITAL_COMMUNITY)
Admission: RE | Admit: 2015-05-06 | Discharge: 2015-05-06 | Disposition: A | Discharge status: Home / Self Care | Payer: Medicare Other | Source: Ambulatory Visit | Attending: Cardiovascular Disease | Admitting: Cardiovascular Disease

## 2015-05-06 ENCOUNTER — Other Ambulatory Visit (HOSPITAL_COMMUNITY): Payer: BC Managed Care – PPO

## 2015-05-06 DIAGNOSIS — I340 Nonrheumatic mitral (valve) insufficiency: Secondary | ICD-10-CM | POA: Insufficient documentation

## 2015-05-06 DIAGNOSIS — I350 Nonrheumatic aortic (valve) stenosis: Secondary | ICD-10-CM | POA: Insufficient documentation

## 2015-05-06 DIAGNOSIS — I071 Rheumatic tricuspid insufficiency: Secondary | ICD-10-CM | POA: Diagnosis not present

## 2015-05-06 DIAGNOSIS — I358 Other nonrheumatic aortic valve disorders: Secondary | ICD-10-CM | POA: Diagnosis not present

## 2015-05-06 DIAGNOSIS — I351 Nonrheumatic aortic (valve) insufficiency: Secondary | ICD-10-CM | POA: Diagnosis not present

## 2015-05-26 ENCOUNTER — Other Ambulatory Visit: Payer: Self-pay

## 2015-05-26 DIAGNOSIS — Z1231 Encounter for screening mammogram for malignant neoplasm of breast: Secondary | ICD-10-CM

## 2015-05-30 ENCOUNTER — Ambulatory Visit (INDEPENDENT_AMBULATORY_CARE_PROVIDER_SITE_OTHER): Payer: Medicare Other | Admitting: Cardiology

## 2015-05-30 ENCOUNTER — Encounter: Payer: Self-pay | Admitting: Cardiology

## 2015-05-30 VITALS — BP 151/73 | HR 90 | Ht 65.0 in | Wt 160.0 lb

## 2015-05-30 DIAGNOSIS — I10 Essential (primary) hypertension: Secondary | ICD-10-CM | POA: Insufficient documentation

## 2015-05-30 DIAGNOSIS — E785 Hyperlipidemia, unspecified: Secondary | ICD-10-CM | POA: Diagnosis not present

## 2015-05-30 DIAGNOSIS — I350 Nonrheumatic aortic (valve) stenosis: Secondary | ICD-10-CM | POA: Insufficient documentation

## 2015-05-30 DIAGNOSIS — R002 Palpitations: Secondary | ICD-10-CM

## 2015-05-30 HISTORY — DX: Essential (primary) hypertension: I10

## 2015-05-30 HISTORY — DX: Palpitations: R00.2

## 2015-05-30 MED ORDER — AMLODIPINE BESYLATE 5 MG PO TABS: 5.0000 mg | ORAL_TABLET | Freq: Every day | ORAL | Status: DC

## 2015-05-30 MED ORDER — METOPROLOL SUCCINATE ER 25 MG PO TB24: 25.0000 mg | ORAL_TABLET | Freq: Every day | ORAL | Status: DC

## 2015-05-30 NOTE — Progress Notes (Signed)
HPI: 74 yo female for evaluation of palpitations; cardiac catheterization May 2012 showed normal LV function and no obstructive coronary disease; mild AS with AVA 1.2 cm2; echocardiogram June 2016 showed normal LV function, grade 1 diastolic dysfunction, moderate aortic stenosis with mean gradient 15 mmHg, mild aortic insufficiency, mild mitral regurgitation. Patient typically does not have dyspnea on exertion, orthopnea, PND, pedal edema, syncope or exertional chest pain. Yesterday while she was working she developed diarrhea followed by diaphoresis and then dizziness/weakness. Her blood pressure was low with a systolic in the 54M. There was no chest pain, nausea or dyspnea. There was no syncope. She feels better today. She presents for further evaluation.  Current Outpatient Prescriptions  Medication Sig Dispense Refill  . acetaminophen (TYLENOL) 650 MG CR tablet Take 650 mg by mouth every 8 (eight) hours as needed for pain.    Marland Kitchen amLODipine-benazepril (LOTREL) 5-20 MG per capsule Take 1 capsule by mouth every morning.    Marland Kitchen aspirin EC 81 MG tablet Take 81 mg by mouth daily.    . calcium-vitamin D (OSCAL WITH D) 500-200 MG-UNIT per tablet Take 1 tablet by mouth 2 (two) times daily.    . cholecalciferol (VITAMIN D) 1000 UNITS tablet Take 1,000 Units by mouth daily.    . Coenzyme Q10 (CO Q 10 PO) Take 300 mg by mouth daily.    . Coenzyme Q10-Vitamin E 100-300 MG-UNIT WAFR Take by mouth.    . hydrochlorothiazide (HYDRODIURIL) 25 MG tablet Take 25 mg by mouth every morning.    . Multiple Vitamins-Minerals (ICAPS PO) Take 1 tablet by mouth 2 (two) times daily.    . Omega 3 1200 MG CAPS Take 1,200 mg by mouth daily.    . simvastatin (ZOCOR) 20 MG tablet Take 20 mg by mouth daily.    Marland Kitchen zolpidem (AMBIEN) 10 MG tablet Take 5 mg by mouth at bedtime as needed for sleep.     No current facility-administered medications for this visit.    Allergies  Allergen Reactions  . Codeine Nausea And Vomiting       Past Medical History  Diagnosis Date  . Hypertension   . Macular degeneration of both eyes     DRY, RIGHT EYE WORSE THAN LEFT  . Aortic stenosis   . Hyperlipidemia     Past Surgical History  Procedure Laterality Date  . Tonsillectomy  AS CHILD  . Laparoscopy  YRS AGO  . Cholecystectomy  10 YRS AGO  . Eye surgery Bilateral LAST FEW YEARS    LENS REPLACEMENT  FOR CATARACTS  . Colonoscopy with propofol N/A 05/07/2014    Procedure: COLONOSCOPY WITH PROPOFOL;  Surgeon: Garlan Fair, MD;  Location: [facility];  Service: Endoscopy;  Laterality: N/A;    History   Social History  . Marital Status: Married    Spouse Name: N/A  . Number of Children: 1  . Years of Education: N/A   Occupational History  . Not on file.   Social History Main Topics  . Smoking status: Former Smoker -- 1.00 packs/day    Types: Cigarettes    Quit date: 11/08/1992  . Smokeless tobacco: Never Used  . Alcohol Use: Yes     Comment: RARE  . Drug Use: No  . Sexual Activity: Not on file   Other Topics Concern  . Not on file   Social History Narrative    Family History  Problem Relation Age of Onset  . Cancer Mother   . COPD  Father   . CAD Mother     CABG 53s    ROS: no fevers or chills, productive cough, hemoptysis, dysphasia, odynophagia, melena, hematochezia, dysuria, hematuria, rash, seizure activity, orthopnea, PND, pedal edema, claudication. Remaining systems are negative.  Physical Exam:   Blood pressure 151/73, pulse 90, height 5\' 5"  (1.651 m), weight 160 lb (72.576 kg).  General:  Well developed/well nourished in NAD Skin warm/dry Patient not depressed No peripheral clubbing Back-normal HEENT-normal/normal eyelids Neck supple/normal carotid upstroke bilaterally; no bruits; no JVD; no thyromegaly chest - CTA/ normal expansion CV - RRR/normal S1 and S2; no rubs or gallops;  PMI nondisplaced, 2/6 systolic murmur left sternal border. S2 is not diminished. Abdomen -NT/ND,  no HSM, no mass, + bowel sounds, no bruit 2+ femoral pulses, no bruits Ext-no edema, chords, 2+ DP Neuro-grossly nonfocal  ECG 05/30/2015-sinus rhythm with PACs and PVCs. Otherwise normal.

## 2015-05-30 NOTE — Assessment & Plan Note (Signed)
Continue statin. 

## 2015-05-30 NOTE — Assessment & Plan Note (Signed)
Follow-up echocardiogram June 2017.

## 2015-05-30 NOTE — Patient Instructions (Signed)
Your physician recommends that you schedule a follow-up appointment in: [place]  Your physician recommends that you schedule a follow-up appointment in: 3 MONTHS WITH DR CRENSHAW  STOP LOTREL  STOP HCTZ  START AMLODIPINE 5 MG ONCE DAILY  START METOPROLOL SUCC ER 25 MG ONCE DAILY AT BEDTIME

## 2015-05-30 NOTE — Assessment & Plan Note (Signed)
Patient had an episode of weakness yesterday. Her blood pressure was low. We are adjusting her blood pressure medications as described. Her primary care physician ordered a monitor and we will await those results. She does have palpitations occasionally and I have changed her medications to include a beta blocker. We will also await blood work drawn at her primary care physician's office today.

## 2015-05-30 NOTE — Assessment & Plan Note (Signed)
Patient's blood pressure appears to be running low. She is also having palpitations.I will discontinue Lotrel and hydrochlorothiazide. I will treat with Norvasc 5 mg daily and add Toprol 25 mg daily. She will follow her blood pressure at home and we will advance medications as needed.

## 2015-06-02 ENCOUNTER — Encounter: Payer: Self-pay | Admitting: Cardiology

## 2015-06-02 DIAGNOSIS — I499 Cardiac arrhythmia, unspecified: Secondary | ICD-10-CM

## 2015-06-02 HISTORY — DX: Cardiac arrhythmia, unspecified: I49.9

## 2015-06-03 ENCOUNTER — Ambulatory Visit (INDEPENDENT_AMBULATORY_CARE_PROVIDER_SITE_OTHER): Payer: Medicare Other

## 2015-06-03 DIAGNOSIS — I499 Cardiac arrhythmia, unspecified: Secondary | ICD-10-CM | POA: Diagnosis not present

## 2015-06-03 DIAGNOSIS — R002 Palpitations: Secondary | ICD-10-CM | POA: Diagnosis not present

## 2015-06-09 ENCOUNTER — Telehealth: Payer: Self-pay | Admitting: Cardiology

## 2015-06-09 NOTE — Telephone Encounter (Signed)
Spoke with pt, this is the first reading she had gotten that is elevated. She will continue to track her blood pressure and cal if continues to be elevated. Pt agreed with this plan.

## 2015-06-09 NOTE — Telephone Encounter (Signed)
Please call,pt blood pressure have been low,now it is high today it was 156/88 ,2 hours later it was 147/71. This was after she she took her blood pressure medicine.

## 2015-07-02 ENCOUNTER — Telehealth: Payer: Self-pay | Admitting: Cardiology

## 2015-07-02 NOTE — Telephone Encounter (Signed)
Closed encounter °

## 2015-07-21 ENCOUNTER — Ambulatory Visit
Admission: RE | Admit: 2015-07-21 | Discharge: 2015-07-21 | Disposition: A | Discharge status: Home / Self Care | Payer: Medicare Other | Source: Ambulatory Visit

## 2015-07-21 DIAGNOSIS — Z1231 Encounter for screening mammogram for malignant neoplasm of breast: Secondary | ICD-10-CM

## 2015-07-23 ENCOUNTER — Encounter: Payer: Self-pay | Admitting: Physician Assistant

## 2015-07-23 ENCOUNTER — Other Ambulatory Visit: Payer: Self-pay | Admitting: Obstetrics and Gynecology

## 2015-07-23 ENCOUNTER — Ambulatory Visit (INDEPENDENT_AMBULATORY_CARE_PROVIDER_SITE_OTHER): Payer: Medicare Other | Admitting: Physician Assistant

## 2015-07-23 VITALS — BP 142/76 | HR 72 | Ht 65.0 in | Wt 164.9 lb

## 2015-07-23 DIAGNOSIS — I10 Essential (primary) hypertension: Secondary | ICD-10-CM

## 2015-07-23 DIAGNOSIS — I471 Supraventricular tachycardia, unspecified: Secondary | ICD-10-CM

## 2015-07-23 DIAGNOSIS — I493 Ventricular premature depolarization: Secondary | ICD-10-CM | POA: Insufficient documentation

## 2015-07-23 DIAGNOSIS — R928 Other abnormal and inconclusive findings on diagnostic imaging of breast: Secondary | ICD-10-CM

## 2015-07-23 HISTORY — DX: Supraventricular tachycardia: I47.1

## 2015-07-23 HISTORY — DX: Ventricular premature depolarization: I49.3

## 2015-07-23 HISTORY — DX: Supraventricular tachycardia, unspecified: I47.10

## 2015-07-23 MED ORDER — AMLODIPINE BESYLATE 10 MG PO TABS: 10.0000 mg | ORAL_TABLET | Freq: Every day | ORAL | Status: DC

## 2015-07-23 NOTE — Progress Notes (Addendum)
Patient ID: [name], female   DOB: 1940-12-19, 74 y.o.   MRN: [id number]    Date:  07/23/2015   ID:  [name], DOB [birth year], MRN [id number]  PCP:  Mathews Argyle, MD  Primary Cardiologist:  Stanford Breed   Chief complaint: follow-up heart monitor   History of Present Illness: [name] is a 74 y.o. female with a history of palpitations; cardiac catheterization May 2012 showed normal LV function and no obstructive coronary disease; mild AS with AVA 1.2 cm2; echocardiogram June 2016 showed normal LV function, grade 1 diastolic dysfunction, moderate aortic stenosis with mean gradient 15 mmHg, mild aortic insufficiency, mild mitral regurgitation.   When she was seen in July she reported developing diarrhea followed by diaphoresis and then dizziness/weakness  The day before while at work. Her blood pressure was low with a systolic in the 96V.   Her primary care provider ordered a heart rate monitor.   This revealed normal sinus rhythm with frequent PACs and PVCs.   The minimum heart rate of 46 with a maximum 134. She had very short run of what looks like supraventricular tachycardia.   Interestingly on the second day of heart monitoring, there was rare PACs or PVCs.  She presents now for follow-up.  She reports feeling better. She's had no further episodes  That she likes she described back in July. She brought in a summary of her blood pressures  Taking 15 times from August 1 to September 14. Mostly ranging in the 893Y systolic. Low BP was 116/72 with a high of 158/80.    The patient currently denies nausea, vomiting, fever, chest pain, shortness of breath, orthopnea, dizziness, PND, cough, congestion, abdominal pain, hematochezia, melena, lower extremity edema, claudication.  Wt Readings from Last 3 Encounters:  07/23/15 164 lb 14.4 oz (74.798 kg)  05/30/15 160 lb (72.576 kg)  05/07/14 165 lb (74.844 kg)     Past Medical History  Diagnosis Date  . Hypertension   . Macular  degeneration of both eyes     DRY, RIGHT EYE WORSE THAN LEFT  . Aortic stenosis   . Hyperlipidemia     Current Outpatient Prescriptions  Medication Sig Dispense Refill  . acetaminophen (TYLENOL) 650 MG CR tablet Take 650 mg by mouth every 8 (eight) hours as needed for pain.    Marland Kitchen aspirin EC 81 MG tablet Take 81 mg by mouth daily.    . calcium-vitamin D (OSCAL WITH D) 500-200 MG-UNIT per tablet Take 1 tablet by mouth 2 (two) times daily.    . cholecalciferol (VITAMIN D) 1000 UNITS tablet Take 1,000 Units by mouth daily.    . Coenzyme Q10 (CO Q 10 PO) Take 300 mg by mouth daily.    . Coenzyme Q10-Vitamin E 100-300 MG-UNIT WAFR Take by mouth.    . Glucosamine-Chondroit-Vit C-Mn (GLUCOSAMINE 1500 COMPLEX PO) Take 1,500 mg by mouth 2 (two) times daily.    . metoprolol succinate (TOPROL XL) 25 MG 24 hr tablet Take 1 tablet (25 mg total) by mouth daily. 90 tablet 3  . Multiple Vitamins-Minerals (ICAPS PO) Take 1 tablet by mouth 2 (two) times daily.    . Omega 3 1200 MG CAPS Take 1,200 mg by mouth daily.    . simvastatin (ZOCOR) 20 MG tablet Take 20 mg by mouth daily.    Marland Kitchen zolpidem (AMBIEN) 10 MG tablet Take 5 mg by mouth at bedtime as needed for sleep.    Marland Kitchen amLODipine (NORVASC) 10 MG tablet Take  1 tablet (10 mg total) by mouth daily. 180 tablet 3   No current facility-administered medications for this visit.    Allergies:    Allergies  Allergen Reactions  . Codeine Nausea And Vomiting    Social History:  The patient  reports that she quit smoking about 22 years ago. Her smoking use included Cigarettes. She smoked 1.00 pack per day. She has never used smokeless tobacco. She reports that she drinks alcohol. She reports that she does not use illicit drugs.   Family history:   Family History  Problem Relation Age of Onset  . Cancer Mother   . COPD Father   . CAD Mother     CABG 70s    ROS:  Please see the history of present illness.  All other systems reviewed and negative.   PHYSICAL  EXAM: VS:  BP 142/76 mmHg  Pulse 72  Ht 5\' 5"  (1.651 m)  Wt 164 lb 14.4 oz (74.798 kg)  BMI 27.44 kg/m2 Well nourished, well developed, in no acute distress HEENT: Pupils are equal round react to light accommodation extraocular movements are intact.  Neck: no JVDNo cervical lymphadenopathy. Cardiac: Regular rate and rhythm with2/6 sys MM. Lungs:  clear to auscultation bilaterally, no wheezing, rhonchi or rales Ext: no lower extremity edema.  2+ radial and dorsalis pedis pulses. Skin: warm and dry Neuro:  Grossly normal   ASSESSMENT AND PLAN:  Problem List Items Addressed This Visit    PVC's (premature ventricular contractions)   Relevant Medications   amLODipine (NORVASC) 10 MG tablet   PSVT (paroxysmal supraventricular tachycardia) - Primary   Relevant Medications   amLODipine (NORVASC) 10 MG tablet   Essential hypertension   Relevant Medications   amLODipine (NORVASC) 10 MG tablet       Essential hypertension:   Increase amlodipine to 10 mg daily.   PSVT , PVCs :  It is interesting at the patient's second day of heart monitoring data looks completely different than the first day. I suspect that her metoprolol reach a more therapeutic level at that point, or what ever possible GI illness she had before, was resolving.   There is also the possibility she had a more prolonged run of SVT. This certainly would not explain the diarrhea, however, she has felt better since that time, which is also when she was started on metoprolol.   If she has another episode, she will check her blood pressure and heart rate.   she will follow-up as scheduled toward the end of October.

## 2015-07-23 NOTE — Patient Instructions (Signed)
Your physician has recommended you make the following change in your medication: the amlodipine has been increased to 10 mg and has already been sent to your pharmacy.   Your physician recommends that you schedule a follow-up appointment in: keep November appointment.

## 2015-07-29 ENCOUNTER — Telehealth: Payer: Self-pay | Admitting: Cardiology

## 2015-07-29 NOTE — Telephone Encounter (Signed)
Left message for pt of dr crenshaw's recommendations  

## 2015-07-29 NOTE — Telephone Encounter (Signed)
No answer/pickup when dialed.

## 2015-07-29 NOTE — Telephone Encounter (Signed)
Pt called in wanting to speak with Dr. Jacalyn Lefevre nurse about another PSVT episode she had on 9/15. Please f/u with the pt  Thanks

## 2015-07-29 NOTE — Telephone Encounter (Signed)
Called patient, no answer when dialed. Per Gaspar Bidding Hager's recent OV note, if pt was to have another episode of SVT, she has been instructed to check HR and BP.

## 2015-07-29 NOTE — Telephone Encounter (Signed)
Continue present meds and fu as scheduled Kirk Ruths

## 2015-07-29 NOTE — Telephone Encounter (Signed)
Pt called back. Seen last Thursday in office by Tarri Fuller. Notes SVT run on Friday afternoon - she did not recall HR, states BP was 90/50 - she stated symptoms of "just feeling terrible", weak. This resolved ~45 mins later. HR & BP returned to normal.  Answered questions regarding vagal maneuvers.  Pt has office appt w Dr. Stanford Breed on 10/24.  Informed her I would route to Dr. Stanford Breed for advice.

## 2015-07-30 ENCOUNTER — Ambulatory Visit
Admission: RE | Admit: 2015-07-30 | Discharge: 2015-07-30 | Disposition: A | Discharge status: Home / Self Care | Payer: Medicare Other | Source: Ambulatory Visit | Attending: Obstetrics and Gynecology | Admitting: Obstetrics and Gynecology

## 2015-07-30 DIAGNOSIS — R928 Other abnormal and inconclusive findings on diagnostic imaging of breast: Secondary | ICD-10-CM

## 2015-08-26 NOTE — Progress Notes (Signed)
HPI: 74 yo female for evaluation of palpitations; cardiac catheterization May 2012 showed normal LV function and no obstructive coronary disease; mild AS with AVA 1.2 cm2; echocardiogram June 2016 showed normal LV function, grade 1 diastolic dysfunction, moderate aortic stenosis with mean gradient 15 mmHg, mild aortic insufficiency, mild mitral regurgitation. Holter monitor July 2016 showed sinus rhythm with first-degree AV block, PACs, PVCs and brief PAT. Since last seen She had an episode on September 15 have extreme weakness associated with nausea and vomiting. There was no chest pain, palpitations or syncope. The episode lasted 45 minutes. She otherwise denies dyspnea on exertion, orthopnea, PND, pedal edema, exertional chest pain or syncope.   Current Outpatient Prescriptions  Medication Sig Dispense Refill  . acetaminophen (TYLENOL) 650 MG CR tablet Take 650 mg by mouth every 8 (eight) hours as needed for pain.    Marland Kitchen amLODipine (NORVASC) 10 MG tablet Take 1 tablet (10 mg total) by mouth daily. 180 tablet 3  . aspirin EC 81 MG tablet Take 81 mg by mouth daily.    . calcium-vitamin D (OSCAL WITH D) 500-200 MG-UNIT per tablet Take 1 tablet by mouth 2 (two) times daily.    . cholecalciferol (VITAMIN D) 1000 UNITS tablet Take 1,000 Units by mouth daily.    . Coenzyme Q10 (CO Q 10 PO) Take 300 mg by mouth daily.    . Coenzyme Q10-Vitamin E 100-300 MG-UNIT WAFR Take by mouth.    . Glucosamine-Chondroit-Vit C-Mn (GLUCOSAMINE 1500 COMPLEX PO) Take 1,500 mg by mouth 2 (two) times daily.    . metoprolol succinate (TOPROL XL) 25 MG 24 hr tablet Take 1 tablet (25 mg total) by mouth daily. 90 tablet 3  . Multiple Vitamins-Minerals (ICAPS PO) Take 1 tablet by mouth 2 (two) times daily.    . Omega 3 1200 MG CAPS Take 1,200 mg by mouth daily.    . simvastatin (ZOCOR) 20 MG tablet Take 20 mg by mouth daily.    Marland Kitchen zolpidem (AMBIEN) 10 MG tablet Take 5 mg by mouth at bedtime as needed for sleep.     No  current facility-administered medications for this visit.     Past Medical History  Diagnosis Date  . Hypertension   . Macular degeneration of both eyes     DRY, RIGHT EYE WORSE THAN LEFT  . Aortic stenosis   . Hyperlipidemia     Past Surgical History  Procedure Laterality Date  . Tonsillectomy  AS CHILD  . Laparoscopy  YRS AGO  . Cholecystectomy  10 YRS AGO  . Eye surgery Bilateral LAST FEW YEARS    LENS REPLACEMENT  FOR CATARACTS  . Colonoscopy with propofol N/A 05/07/2014    Procedure: COLONOSCOPY WITH PROPOFOL;  Surgeon: Garlan Fair, MD;  Location: [facility];  Service: Endoscopy;  Laterality: N/A;    Social History   Social History  . Marital Status: Married    Spouse Name: N/A  . Number of Children: 1  . Years of Education: N/A   Occupational History  . Not on file.   Social History Main Topics  . Smoking status: Former Smoker -- 1.00 packs/day    Types: Cigarettes    Quit date: 11/08/1992  . Smokeless tobacco: Never Used  . Alcohol Use: Yes     Comment: RARE  . Drug Use: No  . Sexual Activity: Not on file   Other Topics Concern  . Not on file   Social History Narrative  ROS: no fevers or chills, productive cough, hemoptysis, dysphasia, odynophagia, melena, hematochezia, dysuria, hematuria, rash, seizure activity, orthopnea, PND, pedal edema, claudication. Remaining systems are negative.  Physical Exam: Well-developed well-nourished in no acute distress.  Skin is warm and dry.  HEENT is normal.  Neck is supple.  Chest is clear to auscultation with normal expansion.  Cardiovascular exam is regular rate and rhythm. 2/6 systolic murmur left sternal border. S2 is not diminished. Abdominal exam nontender or distended. No masses palpated. Extremities show no edema. neuro grossly intact  ECG Sinus rhythm at a rate of 76. No ST changes.

## 2015-09-01 ENCOUNTER — Encounter: Payer: Self-pay | Admitting: Cardiology

## 2015-09-01 ENCOUNTER — Ambulatory Visit (INDEPENDENT_AMBULATORY_CARE_PROVIDER_SITE_OTHER): Payer: Medicare Other | Admitting: Cardiology

## 2015-09-01 VITALS — BP 150/70 | HR 76 | Ht 65.0 in | Wt 162.6 lb

## 2015-09-01 DIAGNOSIS — E785 Hyperlipidemia, unspecified: Secondary | ICD-10-CM | POA: Diagnosis not present

## 2015-09-01 DIAGNOSIS — I471 Supraventricular tachycardia: Secondary | ICD-10-CM

## 2015-09-01 DIAGNOSIS — R002 Palpitations: Secondary | ICD-10-CM | POA: Diagnosis not present

## 2015-09-01 DIAGNOSIS — I10 Essential (primary) hypertension: Secondary | ICD-10-CM | POA: Diagnosis not present

## 2015-09-01 DIAGNOSIS — I350 Nonrheumatic aortic (valve) stenosis: Secondary | ICD-10-CM

## 2015-09-01 NOTE — Patient Instructions (Signed)
Your physician recommends that you schedule a follow-up appointment in: 3 MONTHS WITH DR CRENSHAW  

## 2015-09-01 NOTE — Assessment & Plan Note (Signed)
Continue statin. 

## 2015-09-01 NOTE — Assessment & Plan Note (Signed)
Patient with brief PAT on monitor. Symptoms are controlled with metoprolol. Will continue.

## 2015-09-01 NOTE — Assessment & Plan Note (Signed)
Aortic stenosis moderate on most recent echocardiogram. Plan follow-up study July 2017. I'm not convinced her recent episode of nausea and vomiting/weakness was related to aortic stenosis.

## 2015-09-01 NOTE — Assessment & Plan Note (Signed)
Blood pressure is mildly elevated.However she states typically controlled at home. Continue present medications and follow.

## 2015-11-21 ENCOUNTER — Ambulatory Visit: Payer: Medicare Other | Admitting: Cardiology

## 2015-12-16 NOTE — Progress Notes (Signed)
HPI: FU AS and palpitations; cardiac catheterization May 2012 showed normal LV function and no obstructive coronary disease; mild AS with AVA 1.2 cm2; echocardiogram June 2016 showed normal LV function, grade 1 diastolic dysfunction, moderate aortic stenosis with mean gradient 15 mmHg, mild aortic insufficiency, mild mitral regurgitation. Holter monitor July 2016 showed sinus rhythm with first-degree AV block, PACs, PVCs and brief PAT. Since last seen the patient denies any dyspnea on exertion, orthopnea, PND, pedal edema, palpitations, syncope or chest pain.   Current Outpatient Prescriptions  Medication Sig Dispense Refill  . acetaminophen (TYLENOL) 650 MG CR tablet Take 650 mg by mouth every 8 (eight) hours as needed for pain.    Marland Kitchen amLODipine (NORVASC) 10 MG tablet Take 1 tablet (10 mg total) by mouth daily. 180 tablet 3  . aspirin EC 81 MG tablet Take 81 mg by mouth daily.    . calcium-vitamin D (OSCAL WITH D) 500-200 MG-UNIT per tablet Take 1 tablet by mouth 2 (two) times daily.    . cholecalciferol (VITAMIN D) 1000 UNITS tablet Take 1,000 Units by mouth daily.    . Coenzyme Q10 (CO Q 10 PO) Take 300 mg by mouth daily.    . Coenzyme Q10-Vitamin E 100-300 MG-UNIT WAFR Take by mouth.    . Glucosamine-Chondroit-Vit C-Mn (GLUCOSAMINE 1500 COMPLEX PO) Take 1,500 mg by mouth 2 (two) times daily.    . metoprolol succinate (TOPROL XL) 25 MG 24 hr tablet Take 1 tablet (25 mg total) by mouth daily. 90 tablet 3  . Multiple Vitamins-Minerals (ICAPS PO) Take 1 tablet by mouth 2 (two) times daily.    . Omega 3 1200 MG CAPS Take 1,200 mg by mouth daily.    . simvastatin (ZOCOR) 20 MG tablet Take 20 mg by mouth daily.    Marland Kitchen zolpidem (AMBIEN) 10 MG tablet Take 5 mg by mouth at bedtime as needed for sleep.    Marland Kitchen tobramycin (TOBREX) 0.3 % ophthalmic solution Place 1 drop into the right eye 4 (four) times daily.  5   No current facility-administered medications for this visit.     Past Medical  History  Diagnosis Date  . Hypertension   . Macular degeneration of both eyes     DRY, RIGHT EYE WORSE THAN LEFT  . Aortic stenosis   . Hyperlipidemia     Past Surgical History  Procedure Laterality Date  . Tonsillectomy  AS CHILD  . Laparoscopy  YRS AGO  . Cholecystectomy  10 YRS AGO  . Eye surgery Bilateral LAST FEW YEARS    LENS REPLACEMENT  FOR CATARACTS  . Colonoscopy with propofol N/A 05/07/2014    Procedure: COLONOSCOPY WITH PROPOFOL;  Surgeon: Garlan Fair, MD;  Location: [facility];  Service: Endoscopy;  Laterality: N/A;    Social History   Social History  . Marital Status: Married    Spouse Name: N/A  . Number of Children: 1  . Years of Education: N/A   Occupational History  . Not on file.   Social History Main Topics  . Smoking status: Former Smoker -- 1.00 packs/day    Types: Cigarettes    Quit date: 11/08/1992  . Smokeless tobacco: Never Used  . Alcohol Use: 0.0 oz/week    0 Standard drinks or equivalent per week     Comment: RARE  . Drug Use: No  . Sexual Activity: Not on file   Other Topics Concern  . Not on file   Social History Narrative  Family History  Problem Relation Age of Onset  . Cancer Mother   . COPD Father   . CAD Mother     CABG 25s    ROS: no fevers or chills, productive cough, hemoptysis, dysphasia, odynophagia, melena, hematochezia, dysuria, hematuria, rash, seizure activity, orthopnea, PND, pedal edema, claudication. Remaining systems are negative.  Physical Exam: Well-developed well-nourished in no acute distress.  Skin is warm and dry.  HEENT is normal.  Neck is supple.  Chest is clear to auscultation with normal expansion.  Cardiovascular exam is regular rate and rhythm. 2/6 systolic murmur left sternal border. S2 is not diminished. Abdominal exam nontender or distended. No masses palpated. Extremities show no edema. neuro grossly intact

## 2015-12-22 ENCOUNTER — Encounter: Payer: Self-pay | Admitting: Cardiology

## 2015-12-22 ENCOUNTER — Ambulatory Visit (INDEPENDENT_AMBULATORY_CARE_PROVIDER_SITE_OTHER): Payer: Medicare Other | Admitting: Cardiology

## 2015-12-22 VITALS — BP 160/60 | HR 70 | Ht 65.0 in | Wt 169.7 lb

## 2015-12-22 DIAGNOSIS — I350 Nonrheumatic aortic (valve) stenosis: Secondary | ICD-10-CM

## 2015-12-22 NOTE — Assessment & Plan Note (Signed)
Continue statin. 

## 2015-12-22 NOTE — Patient Instructions (Signed)
Medication Instructions:   NO CHANGE  Testing/Procedures:  Your physician has requested that you have an echocardiogram. Echocardiography is a painless test that uses sound waves to create images of your heart. It provides your doctor with information about the size and shape of your heart and how well your heart's chambers and valves are working. This procedure takes approximately one hour. There are no restrictions for this procedure.  SCHEDULE IN JULY  Follow-Up:  Your physician wants you to follow-up in: [place] will receive a reminder letter in the mail two months in advance. If you don't receive a letter, please call our office to schedule the follow-up appointment.   If you need a refill on your cardiac medications before your next appointment, please call your pharmacy.

## 2015-12-22 NOTE — Assessment & Plan Note (Signed)
Symptoms are much improved. Continue beta blocker.

## 2015-12-22 NOTE — Assessment & Plan Note (Addendum)
   Plan follow-up echocardiogram July 2017. I have explained that she will likely require aortic valve replacement in the future and that  Aortic stenosis is a progressive process.

## 2015-12-22 NOTE — Assessment & Plan Note (Signed)
Blood pressure mildly elevated.She will track this at home and we will advance medications as needed.

## 2016-05-09 ENCOUNTER — Other Ambulatory Visit: Payer: Self-pay | Admitting: Cardiology

## 2016-05-13 ENCOUNTER — Other Ambulatory Visit: Payer: Self-pay | Admitting: Obstetrics and Gynecology

## 2016-05-13 DIAGNOSIS — Z1231 Encounter for screening mammogram for malignant neoplasm of breast: Secondary | ICD-10-CM

## 2016-05-25 ENCOUNTER — Ambulatory Visit
Admission: RE | Admit: 2016-05-25 | Discharge: 2016-05-25 | Disposition: A | Discharge status: Home / Self Care | Payer: Medicare Other | Source: Ambulatory Visit | Attending: Geriatric Medicine | Admitting: Geriatric Medicine

## 2016-05-25 ENCOUNTER — Other Ambulatory Visit: Payer: Self-pay | Admitting: Geriatric Medicine

## 2016-05-25 DIAGNOSIS — M545 Low back pain: Secondary | ICD-10-CM

## 2016-05-31 ENCOUNTER — Ambulatory Visit: Payer: Medicare Other | Attending: Geriatric Medicine | Admitting: Physical Therapy

## 2016-05-31 ENCOUNTER — Encounter: Payer: Self-pay | Admitting: Physical Therapy

## 2016-05-31 ENCOUNTER — Encounter (INDEPENDENT_AMBULATORY_CARE_PROVIDER_SITE_OTHER): Payer: Self-pay

## 2016-05-31 ENCOUNTER — Other Ambulatory Visit (HOSPITAL_COMMUNITY): Payer: Self-pay

## 2016-05-31 ENCOUNTER — Ambulatory Visit (HOSPITAL_COMMUNITY): Payer: Medicare Other | Attending: Internal Medicine

## 2016-05-31 DIAGNOSIS — I350 Nonrheumatic aortic (valve) stenosis: Secondary | ICD-10-CM | POA: Insufficient documentation

## 2016-05-31 DIAGNOSIS — I359 Nonrheumatic aortic valve disorder, unspecified: Secondary | ICD-10-CM | POA: Diagnosis present

## 2016-05-31 DIAGNOSIS — Z87891 Personal history of nicotine dependence: Secondary | ICD-10-CM | POA: Diagnosis not present

## 2016-05-31 DIAGNOSIS — I351 Nonrheumatic aortic (valve) insufficiency: Secondary | ICD-10-CM | POA: Insufficient documentation

## 2016-05-31 DIAGNOSIS — M545 Low back pain, unspecified: Secondary | ICD-10-CM

## 2016-05-31 DIAGNOSIS — I340 Nonrheumatic mitral (valve) insufficiency: Secondary | ICD-10-CM | POA: Insufficient documentation

## 2016-05-31 DIAGNOSIS — I119 Hypertensive heart disease without heart failure: Secondary | ICD-10-CM | POA: Insufficient documentation

## 2016-05-31 DIAGNOSIS — E785 Hyperlipidemia, unspecified: Secondary | ICD-10-CM | POA: Diagnosis not present

## 2016-05-31 NOTE — Therapy (Signed)
[place], [place], [address] Phone: [phone]   Fax:  [phone]  Physical Therapy Evaluation  Patient Details  Name: [name] MRN: [id number] Date of Birth: February 07, 1941 Referring Provider: Lajean Manes, MD  Encounter Date: 05/31/2016      PT End of Session - 05/31/16 1504    Visit Number 1   Number of Visits 9   Date for PT Re-Evaluation 06/28/16   Authorization Type U[employer] MCR, reeval at visit 10, KX at visit 15   PT Start Time 1330   PT Stop Time 1417   PT Time Calculation (min) 47 min   Activity Tolerance Patient tolerated treatment well   Behavior During Therapy [facility] for tasks assessed/performed      Past Medical History:  Diagnosis Date  . Aortic stenosis   . Hyperlipidemia   . Hypertension   . Macular degeneration of both eyes    DRY, RIGHT EYE WORSE THAN LEFT    Past Surgical History:  Procedure Laterality Date  . CHOLECYSTECTOMY  10 YRS AGO  . COLONOSCOPY WITH PROPOFOL N/A 05/07/2014   Procedure: COLONOSCOPY WITH PROPOFOL;  Surgeon: Garlan Fair, MD;  Location: [facility];  Service: Endoscopy;  Laterality: N/A;  . EYE SURGERY Bilateral LAST FEW YEARS   LENS REPLACEMENT  FOR CATARACTS  . LAPAROSCOPY  YRS AGO  . TONSILLECTOMY  AS CHILD    There were no vitals filed for this visit.       Subjective Assessment - 05/31/16 1333    Subjective Many flare-ups over the years. Recently notice LBP that is sharp as you sit down (in the last few weeks). Bending to pick up objects is painful. Denies radicular  pain and equal bilaterally. Walks daily with friends and is not limited.    Diagnostic tests xray- spondylosis L4-5, L5-S1   Patient Stated Goals no pain, bend to reach objects on floor   Currently in Pain? No/denies  no pain as she sits here, below describes pain when it arises   Pain Score --  stand to sit 4/10   Pain Location Back   Pain Orientation Lower;Right;Left   Pain Descriptors / Indicators Aching;Sharp   Pain Type Chronic pain   Pain Onset More than a month ago   Pain Frequency Intermittent   Aggravating Factors  moving from standing to sitting, bending forward to reach   Pain Relieving Factors biofreeze            [facility] PT Assessment - 05/31/16 0001      Assessment   Medical Diagnosis LBP   Referring Provider Lajean Manes, MD   Hand Dominance Right   Next MD Visit 6 months   Prior Therapy none     Precautions   Precautions None     Restrictions   Weight Bearing Restrictions No     Balance Screen   Has the patient fallen in the past 6 months No     [place] residence   Living Arrangements Spouse/significant other   Type of [place] Two level     Prior Function   Level of Independence Independent     Cognition   Overall Cognitive Status Within Functional Limits for tasks assessed     Observation/Other Assessments   Focus on Therapeutic Outcomes (FOTO)  54% ablity     ROM / Strength   AROM / PROM / Strength Strength;AROM  AROM   AROM Assessment Site Lumbar   Lumbar Flexion 40  tight   Lumbar - Left Rotation none notable     Strength   Strength Assessment Site Hip   Right/Left Hip Right;Left   Right Hip Flexion 5/5   Right Hip Extension 4-/5   Right Hip ABduction 4-/5   Left Hip Flexion 5/5   Left Hip Extension 4/5   Left Hip ABduction 4+/5                   [facility] Adult PT Treatment/Exercise - 05/31/16 0001      Exercises   Exercises Knee/Hip     Knee/Hip Exercises: Stretches   Active Hamstring Stretch Other (comment)  x10 each   Piriformis Stretch 2 reps;30 seconds;Both                PT Education - 05/31/16 1311    Education provided Yes   Education Details anatomy of condition, POC, HEP   Person(s) Educated Patient   Methods Explanation;Demonstration;Tactile cues;Verbal cues;Handout   Comprehension Verbalized  understanding;Returned demonstration;Verbal cues required;Tactile cues required;Need further instruction             PT Long Term Goals - 05/31/16 1317      PT LONG TERM GOAL #1   Title FOTO to 65% ability to indicate significant functional improvement by 8/21   Baseline 54% ability at eval   Time 4   Period Weeks   Status New     PT LONG TERM GOAL #2   Title Pt will be able to move from standing to sitting during church without pain   Time 4   Period Weeks   Status New     PT LONG TERM GOAL #3   Title Pt will be able to demonstrate appropriate posture during bending to lift objects from ground without pain   Time 4   Period Weeks   Status New     PT LONG TERM GOAL #4   Title pt will be independent and verbalize comfort with HEP   Time 4   Period Weeks   Status New               Plan - 05/31/16 1725    Clinical Impression Statement Pt presents to pysical therapy with complaints of LBP that is sharp when flexing in lumbar spine and denies radicular pain. Pt is active and walks daily but admits to a lack of stretching in her regimen. Pt R ASIS appears elevated in standing as well as L lateral trunk sidebend at rest. Lack of notable innominate rotation and decrease in pain with LLE long axis traction. Upon measuring, LLD not notable but appears to have a functional discrepancy -L shorter than R. Pt verbalized improvements followiing stretches during session today. Pt will benefit from skilled PT in order to improve flexibility and coordination of lumbo pevlic contractions to decrease pain.    Rehab Potential Good   PT Frequency 2x / week   PT Duration 4 weeks   PT Treatment/Interventions ADLs/Self Care Home Management;Cryotherapy;Electrical Stimulation;Stair training;Gait training;Ultrasound;Traction;Moist Heat;Therapeutic activities;Therapeutic exercise;Balance training;Neuromuscular re-education;Patient/family education;Passive range of motion;Manual techniques;Dry  needling;Taping   PT Next Visit Plan lumbo pelvic and LE stretching, core challenges   PT Home Exercise Plan piriformis stretch, AHSS   Consulted and Agree with Plan of Care Patient      Patient will benefit from skilled therapeutic intervention in order to improve the following deficits and impairments:  Decreased range of  motion, Increased muscle spasms, Pain, Improper body mechanics, Decreased mobility, Decreased strength  Visit Diagnosis: Midline low back pain without sciatica - Plan: PT plan of care cert/re-cert      G-Codes - [id number]    Functional Assessment Tool Used FOTO 46% disability, clinical judgement   Functional Limitation Mobility: Walking and moving around   Mobility: Walking and Moving Around Current Status [phone]) At least 40 percent but less than 60 percent impaired, limited or restricted   Mobility: Walking and Moving Around Goal Status [phone]) At least 20 percent but less than 40 percent impaired, limited or restricted       Problem List Patient Active Problem List   Diagnosis Date Noted  . PSVT (paroxysmal supraventricular tachycardia) ([place]) 07/23/2015  . PVC's (premature ventricular contractions) 07/23/2015  . Irregular heartbeat 06/02/2015  . Essential hypertension 05/30/2015  . Aortic stenosis 05/30/2015  . Hyperlipidemia 05/30/2015  . Palpitations 05/30/2015    Zahari Xiang C. Mory Herrman PT, DPT 05/31/16 5:38 PM   [facility] [address] [place], [place], [address] Phone: [phone]   Fax:  [phone]  Name: ADAISHA HAREWOOD MRN: [id number] Date of Birth: 1941-03-21

## 2016-05-31 NOTE — Patient Instructions (Signed)
Access Code: [email or web address]  URL: [email or web address]  Date: 05/31/2016  Prepared by: Selinda Eon   Exercises  Supine Piriformis Stretch with Foot on Ground - 3 reps - 1 sets - 30 hold - 2x daily - 7x weekly  Supine Active Hamstring Stretch - 10 reps - 1 sets - 5 hold - 2x daily - 7x weekly

## 2016-06-01 ENCOUNTER — Encounter: Payer: Self-pay | Admitting: Cardiology

## 2016-06-01 NOTE — Telephone Encounter (Signed)
This encounter was created in error - please disregard.

## 2016-06-01 NOTE — Telephone Encounter (Signed)
Follow-up     Please call back with the ECHO results.

## 2016-06-07 ENCOUNTER — Ambulatory Visit: Payer: Medicare Other | Admitting: Physical Therapy

## 2016-06-07 ENCOUNTER — Encounter: Payer: Self-pay | Admitting: Physical Therapy

## 2016-06-07 DIAGNOSIS — M545 Low back pain, unspecified: Secondary | ICD-10-CM

## 2016-06-07 NOTE — Therapy (Signed)
[facility] [address] [place], [place], [address] Phone: [phone]   Fax:  [phone]  Physical Therapy Treatment  Patient Details  Name: [name] MRN: [id number] Date of Birth: [birth year] Referring Provider: Merlene Laughter, MD  Encounter Date: 06/07/2016      PT End of Session - 06/07/16 1328    Visit Number 2   Number of Visits 9   Date for PT Re-Evaluation 06/28/16   Authorization Type UHC MCR, reeval at visit 10, KX at visit 15   PT Start Time 1330   PT Stop Time 1418   PT Time Calculation (min) 48 min   Activity Tolerance Patient tolerated treatment well   Behavior During Therapy [facility] for tasks assessed/performed      Past Medical History:  Diagnosis Date  . Aortic stenosis   . Hyperlipidemia   . Hypertension   . Macular degeneration of both eyes    DRY, RIGHT EYE WORSE THAN LEFT    Past Surgical History:  Procedure Laterality Date  . CHOLECYSTECTOMY  10 YRS AGO  . COLONOSCOPY WITH PROPOFOL N/A 05/07/2014   Procedure: COLONOSCOPY WITH PROPOFOL;  Surgeon: Charolett Bumpers, MD;  Location: [facility];  Service: Endoscopy;  Laterality: N/A;  . EYE SURGERY Bilateral LAST FEW YEARS   LENS REPLACEMENT  FOR CATARACTS  . LAPAROSCOPY  YRS AGO  . TONSILLECTOMY  AS CHILD    There were no vitals filed for this visit.      Subjective Assessment - 06/07/16 1332    Subjective Pt reports doing her stretches twice a day and are helping. Tries to be conscious of her back through the day.    Currently in Pain? No/denies                         [facility] Adult PT Treatment/Exercise - 06/07/16 0001      Exercises   Exercises Other Exercises   Other Exercises  Reformer: LE press, LE press with pelvic tilt, bridge series, single leg press with 90/90     Knee/Hip Exercises: Stretches   Piriformis Stretch 2 reps;30 seconds     Knee/Hip Exercises: Prone   Hip Extension Limitations Q ped hip ext      Manual Therapy   Manual Therapy Manual Traction   Manual Traction LLE 3x1 min sustained                PT Education - 06/07/16 1327    Education provided Yes   Education Details exercise form/rationale   Person(s) Educated Patient   Methods Explanation;Demonstration;Tactile cues;Verbal cues   Comprehension Verbalized understanding;Returned demonstration;Verbal cues required;Tactile cues required;Need further instruction             PT Long Term Goals - 05/31/16 1317      PT LONG TERM GOAL #1   Title FOTO to 65% ability to indicate significant functional improvement by 8/21   Baseline 54% ability at eval   Time 4   Period Weeks   Status New     PT LONG TERM GOAL #2   Title Pt will be able to move from standing to sitting during church without pain   Time 4   Period Weeks   Status New     PT LONG TERM GOAL #3   Title Pt will be able to demonstrate appropriate posture during bending to lift objects from ground without pain   Time 4   Period Weeks  Status New     PT LONG TERM GOAL #4   Title pt will be independent and verbalize comfort with HEP   Time 4   Period Weeks   Status New               Plan - 06/07/16 1422    Clinical Impression Statement Pt cont to report decrease in concordant pain with LLE long axis traction. Significant improvement in hip flexibility noted since eval. Demo good coordination to contract transverse abdominis but lacks endurance. Focus today on hip extensor strengthening paired with core activation.    PT Next Visit Plan lumbo pelvic and LE stretching, core challenges   PT Home Exercise Plan piriformis stretch, AHSS, qped hip ext with pelvic tilt   Consulted and Agree with Plan of Care Patient      Patient will benefit from skilled therapeutic intervention in order to improve the following deficits and impairments:     Visit Diagnosis: Midline low back pain without sciatica     Problem List Patient Active  Problem List   Diagnosis Date Noted  . PSVT (paroxysmal supraventricular tachycardia) ([place]) 07/23/2015  . PVC's (premature ventricular contractions) 07/23/2015  . Irregular heartbeat 06/02/2015  . Essential hypertension 05/30/2015  . Aortic stenosis 05/30/2015  . Hyperlipidemia 05/30/2015  . Palpitations 05/30/2015   [name] PT, DPT 06/07/16 2:24 PM   [place] [address] [place], [place], [address] Phone: [phone]   Fax:  [phone]  Name: [name] MRN: [id number] Date of Birth: 02-10-1941

## 2016-06-09 ENCOUNTER — Encounter: Payer: Self-pay | Admitting: Physical Therapy

## 2016-06-09 ENCOUNTER — Ambulatory Visit: Payer: Medicare Other | Attending: Geriatric Medicine | Admitting: Physical Therapy

## 2016-06-09 DIAGNOSIS — M545 Low back pain, unspecified: Secondary | ICD-10-CM

## 2016-06-09 NOTE — Therapy (Signed)
[place], [place], [address] Phone: [phone]   Fax:  [phone]  Physical Therapy Treatment/Discharge Summary  Patient Details  Name: [name] MRN: [id number] Date of Birth: [birth year] Referring Provider: Lajean Manes, MD  Encounter Date: 06/09/2016      PT End of Session - 06/09/16 1410    Visit Number 3   Number of Visits 9   Date for PT Re-Evaluation 06/28/16   Authorization Type [employer] MCR, reeval at visit 10, KX at visit 15   PT Start Time 1415   PT Stop Time 1457   PT Time Calculation (min) 42 min   Activity Tolerance Patient tolerated treatment well   Behavior During Therapy [facility] for tasks assessed/performed      Past Medical History:  Diagnosis Date  . Aortic stenosis   . Hyperlipidemia   . Hypertension   . Macular degeneration of both eyes    DRY, RIGHT EYE WORSE THAN LEFT    Past Surgical History:  Procedure Laterality Date  . CHOLECYSTECTOMY  10 YRS AGO  . COLONOSCOPY WITH PROPOFOL N/A 05/07/2014   Procedure: COLONOSCOPY WITH PROPOFOL;  Surgeon: Garlan Fair, MD;  Location: [facility];  Service: Endoscopy;  Laterality: N/A;  . EYE SURGERY Bilateral LAST FEW YEARS   LENS REPLACEMENT  FOR CATARACTS  . LAPAROSCOPY  YRS AGO  . TONSILLECTOMY  AS CHILD    There were no vitals filed for this visit.      Subjective Assessment - 06/09/16 1411    Subjective Is feeling really good and would like to d/c today.    Currently in Pain? No/denies            [facility] PT Assessment - 06/09/16 0001      Observation/Other Assessments   Focus on Therapeutic Outcomes (FOTO)  83% ability     AROM   Lumbar Flexion WNL   Lumbar - Left Rotation minimal but no pain                     [facility] Adult PT Treatment/Exercise - 06/09/16 0001      Knee/Hip Exercises: Stretches   Active Hamstring Stretch Other (comment)  x10 each   Piriformis Stretch 2 reps;30 seconds;Both      Knee/Hip Exercises: Seated   Sit to Sand 10 reps  with Tband around knees     Knee/Hip Exercises: Supine   Quad Sets 4 sets   Bridges with [employer] 20 reps   Bridges with Clamshell 20 reps  GTB   Other Supine Knee/Hip Exercises transverse abdominis contraction   Other Supine Knee/Hip Exercises hooklying clams GTB                PT Education - 06/09/16 1445    Education provided Yes   Education Details exercise form/rationale, HEP   Person(s) Educated Patient   Methods Explanation;Demonstration;Tactile cues;Verbal cues;Handout   Comprehension Verbalized understanding;Returned demonstration;Verbal cues required;Tactile cues required;Need further instruction             PT Long Term Goals - 06/09/16 1424      PT LONG TERM GOAL #1   Title FOTO to 65% ability to indicate significant functional improvement by 8/21   Baseline 83% ability at d/c   Status Achieved     PT LONG TERM GOAL #2   Title Pt will be able to move from standing to sitting during church without pain   Status Achieved  PT LONG TERM GOAL #3   Title Pt will be able to demonstrate appropriate posture during bending to lift objects from ground without pain   Status Achieved     PT LONG TERM GOAL #4   Title pt will be independent and verbalize comfort with HEP   Status Achieved             Patient will benefit from skilled therapeutic intervention in order to improve the following deficits and impairments:     Visit Diagnosis: Midline low back pain without sciatica       G-Codes - 2016/07/05 1459    Functional Assessment Tool Used 83% ability   Functional Limitation Mobility: Walking and moving around   Mobility: Walking and Moving Around Goal Status [phone]) At least 20 percent but less than 40 percent impaired, limited or restricted   Mobility: Walking and Moving Around Discharge Status [phone]) At least 1 percent but less than 20 percent impaired, limited or restricted       Problem List Patient Active Problem List   Diagnosis Date Noted  . PSVT (paroxysmal supraventricular tachycardia) ([place]) 07/23/2015  . PVC's (premature ventricular contractions) 07/23/2015  . Irregular heartbeat 06/02/2015  . Essential hypertension 05/30/2015  . Aortic stenosis 05/30/2015  . Hyperlipidemia 05/30/2015  . Palpitations 05/30/2015   PHYSICAL THERAPY DISCHARGE SUMMARY  Visits from Start of Care: 3   Current functional level related to goals / functional outcomes: See above   Remaining deficits: See above   Education / Equipment: Anatomy of condition, POC, HEP, exercise form/rationale  Plan: Patient agrees to discharge.  Patient goals were met. Patient is being discharged due to meeting the stated rehab goals.  ?????       Brigette Hopfer C. Lyndel Dancel PT, DPT July 05, 2016 3:00 PM   [place], [place], [address] Phone: [phone]   Fax:  [phone]  Name: [name] MRN: [id number] Date of Birth: June 15, 1941

## 2016-06-09 NOTE — Patient Instructions (Signed)
Access Code: [email or web address]  URL: [email or web address]  Date: 06/09/2016  Prepared by: Selinda Eon   Exercises  Supine Piriformis Stretch with Foot on Ground - 2 reps - 1 sets - 30 hold - 2x daily - 7x weekly  Supine Figure 4 Piriformis Stretch - 2 reps - 1 sets - 30 hold - 2x daily - 7x weekly  Supine Hamstring Stretch - 10 reps - 1 sets - 3 hold - 2x daily - 7x weekly  Hooklying Transversus Abdominis Palpation  Hooklying Clamshell with Resistance - 20 reps - 2 sets - 5 hold - 1x daily - 7x weekly  Supine Bridge with Resistance Band - 10 reps - 3 sets - 5 hold - 1x daily - 7x weekly  Supine Bridge with Mini Swiss Ball Between Knees - 10 reps - 3 sets - 5 hold - 1x daily - 7x weekly  Sit to Stand without Arm Support - 10 reps - 1 sets - 1x daily - 7x weekly

## 2016-06-15 ENCOUNTER — Encounter: Payer: Medicare Other | Admitting: Physical Therapy

## 2016-06-17 ENCOUNTER — Encounter: Payer: Medicare Other | Admitting: Physical Therapy

## 2016-06-21 ENCOUNTER — Encounter: Payer: Medicare Other | Admitting: Physical Therapy

## 2016-06-23 ENCOUNTER — Encounter: Payer: Medicare Other | Admitting: Physical Therapy

## 2016-06-28 ENCOUNTER — Encounter: Payer: Medicare Other | Admitting: Physical Therapy

## 2016-06-29 ENCOUNTER — Encounter: Payer: Medicare Other | Admitting: Physical Therapy

## 2016-07-01 ENCOUNTER — Encounter: Payer: Medicare Other | Admitting: Physical Therapy

## 2016-07-05 ENCOUNTER — Ambulatory Visit: Payer: Medicare Other | Admitting: Cardiology

## 2016-07-05 ENCOUNTER — Ambulatory Visit (INDEPENDENT_AMBULATORY_CARE_PROVIDER_SITE_OTHER): Payer: Medicare Other | Admitting: Cardiology

## 2016-07-05 ENCOUNTER — Encounter: Payer: Self-pay | Admitting: Cardiology

## 2016-07-05 ENCOUNTER — Encounter: Payer: Medicare Other | Admitting: Physical Therapy

## 2016-07-05 VITALS — BP 154/62 | HR 78 | Ht 65.0 in | Wt 164.0 lb

## 2016-07-05 DIAGNOSIS — E785 Hyperlipidemia, unspecified: Secondary | ICD-10-CM | POA: Diagnosis not present

## 2016-07-05 DIAGNOSIS — R002 Palpitations: Secondary | ICD-10-CM | POA: Diagnosis not present

## 2016-07-05 DIAGNOSIS — I10 Essential (primary) hypertension: Secondary | ICD-10-CM | POA: Diagnosis not present

## 2016-07-05 DIAGNOSIS — I350 Nonrheumatic aortic (valve) stenosis: Secondary | ICD-10-CM | POA: Diagnosis not present

## 2016-07-05 NOTE — Patient Instructions (Signed)
Medication Instructions:  NO CHANGE.   Follow-Up: Your physician wants you to follow-up in: [place]. You will receive a reminder letter in the mail two months in advance. If you don't receive a letter, please call our office to schedule the follow-up appointment.   If you need a refill on your cardiac medications before your next appointment, please call your pharmacy.

## 2016-07-05 NOTE — Progress Notes (Signed)
HPI: FU AS and palpitations; cardiac catheterization May 2012 showed normal LV function and no obstructive coronary disease; mild AS with AVA 1.2 cm2. Holter monitor July 2016 showed sinus rhythm with first-degree AV block, PACs, PVCs and brief PAT. Echocardiogram repeated July 2017 and showed normal left ventricular systolic function, grade 2 diastolic dysfunction, mild aortic stenosis with mean gradient 16 mmHg, trace aortic insufficiency, mild mitral regurgitation and mild left atrial enlargement. Since last seen the patient denies any dyspnea on exertion, orthopnea, PND, pedal edema, palpitations, syncope or chest pain.   Current Outpatient Prescriptions  Medication Sig Dispense Refill  . acetaminophen (TYLENOL) 650 MG CR tablet Take 650 mg by mouth every 8 (eight) hours as needed for pain.    Marland Kitchen amLODipine (NORVASC) 10 MG tablet Take 1 tablet (10 mg total) by mouth daily. 180 tablet 3  . aspirin EC 81 MG tablet Take 81 mg by mouth daily.    Marland Kitchen BIOTIN PO Take 500 mg by mouth daily.    . calcium-vitamin D (OSCAL WITH D) 500-200 MG-UNIT per tablet Take 1 tablet by mouth 2 (two) times daily.    . cholecalciferol (VITAMIN D) 1000 UNITS tablet Take 1,000 Units by mouth daily.    . Coenzyme Q10 (CO Q 10 PO) Take 300 mg by mouth daily.    . Glucosamine-Chondroit-Vit C-Mn (GLUCOSAMINE 1500 COMPLEX PO) Take 1,500 mg by mouth 2 (two) times daily.    . hydrochlorothiazide (HYDRODIURIL) 25 MG tablet Take 25 mg by mouth daily.    . metoprolol succinate (TOPROL-XL) 25 MG 24 hr tablet TAKE 1 TABLET (25 MG TOTAL) BY MOUTH DAILY. 90 tablet 0  . Multiple Vitamins-Minerals (ICAPS PO) Take 1 tablet by mouth 2 (two) times daily.    . Omega 3 1200 MG CAPS Take 1,400 mg by mouth daily.     . simvastatin (ZOCOR) 20 MG tablet Take 20 mg by mouth daily.    Marland Kitchen zolpidem (AMBIEN) 10 MG tablet Take 10 mg by mouth at bedtime as needed for sleep.      No current facility-administered medications for this visit.       Past Medical History:  Diagnosis Date  . Aortic stenosis   . Hyperlipidemia   . Hypertension   . Macular degeneration of both eyes    DRY, RIGHT EYE WORSE THAN LEFT    Past Surgical History:  Procedure Laterality Date  . CHOLECYSTECTOMY  10 YRS AGO  . COLONOSCOPY WITH PROPOFOL N/A 05/07/2014   Procedure: COLONOSCOPY WITH PROPOFOL;  Surgeon: Garlan Fair, MD;  Location: [facility];  Service: Endoscopy;  Laterality: N/A;  . EYE SURGERY Bilateral LAST FEW YEARS   LENS REPLACEMENT  FOR CATARACTS  . LAPAROSCOPY  YRS AGO  . TONSILLECTOMY  AS CHILD    Social History   Social History  . Marital status: Married    Spouse name: N/A  . Number of children: 1  . Years of education: N/A   Occupational History  . Not on file.   Social History Main Topics  . Smoking status: Former Smoker    Packs/day: 1.00    Types: Cigarettes    Quit date: 11/08/1992  . Smokeless tobacco: Never Used  . Alcohol use 0.0 oz/week     Comment: RARE  . Drug use: No  . Sexual activity: Not on file   Other Topics Concern  . Not on file   Social History Narrative  . No narrative on file  Family History  Problem Relation Age of Onset  . Cancer Mother   . CAD Mother     CABG 26s  . COPD Father     ROS: no fevers or chills, productive cough, hemoptysis, dysphasia, odynophagia, melena, hematochezia, dysuria, hematuria, rash, seizure activity, orthopnea, PND, pedal edema, claudication. Remaining systems are negative.  Physical Exam: Well-developed well-nourished in no acute distress.  Skin is warm and dry.  HEENT is normal.  Neck is supple.  Chest is clear to auscultation with normal expansion.  Cardiovascular exam is regular rate and rhythm. 2/6 systolic murmur LSB Abdominal exam nontender or distended. No masses palpated. Extremities show no edema. neuro grossly intact  ECG- Sinus rhythm at a rate of 78. No ST changes.  A/P  1 Aortic stenosis-follow-up echocardiogram in  July showed mild aortic stenosis. She remains asymptomatic. She will need follow-up studies in the future. I have described the symptoms she should be concerned with including dyspnea, chest pain and syncope. She understands she may require valve replacement in the future.  2 hypertension-blood pressure is mildly elevated today but she brought records concerning her pressures at home. Her blood pressure is controlled and we will continue present medications.  3 Palpitations-Controlled with beta blockade. Will continue.  4 hyperlipidemia-continue statin.  Kirk Ruths, MD

## 2016-07-22 ENCOUNTER — Ambulatory Visit
Admission: RE | Admit: 2016-07-22 | Discharge: 2016-07-22 | Disposition: A | Discharge status: Home / Self Care | Payer: Medicare Other | Source: Ambulatory Visit | Attending: Obstetrics and Gynecology | Admitting: Obstetrics and Gynecology

## 2016-07-22 DIAGNOSIS — Z1231 Encounter for screening mammogram for malignant neoplasm of breast: Secondary | ICD-10-CM

## 2016-08-17 ENCOUNTER — Other Ambulatory Visit: Payer: Self-pay | Admitting: Cardiology

## 2016-10-12 ENCOUNTER — Other Ambulatory Visit: Payer: Self-pay

## 2016-10-12 MED ORDER — AMLODIPINE BESYLATE 10 MG PO TABS: 10.0000 mg | ORAL_TABLET | Freq: Every day | ORAL | 2 refills | Status: DC

## 2016-10-12 NOTE — Telephone Encounter (Signed)
Rx(s) sent to pharmacy electronically.  

## 2016-11-13 ENCOUNTER — Other Ambulatory Visit: Payer: Self-pay | Admitting: Cardiology

## 2016-11-15 NOTE — Telephone Encounter (Signed)
refill 

## 2017-02-11 ENCOUNTER — Other Ambulatory Visit: Payer: Self-pay | Admitting: Cardiology

## 2017-02-11 NOTE — Telephone Encounter (Signed)
REFILL 

## 2017-04-11 ENCOUNTER — Other Ambulatory Visit: Payer: Self-pay | Admitting: Obstetrics and Gynecology

## 2017-04-11 DIAGNOSIS — Z1231 Encounter for screening mammogram for malignant neoplasm of breast: Secondary | ICD-10-CM

## 2017-06-22 ENCOUNTER — Encounter: Payer: Self-pay | Admitting: Cardiology

## 2017-07-04 NOTE — Progress Notes (Signed)
HPI: FU AS and palpitations; cardiac catheterization May 2012 showed normal LV function and no obstructive coronary disease; mild AS with AVA 1.2 cm2. Holter monitor July 2016 showed sinus rhythm with first-degree AV block, PACs, PVCs and brief PAT. Echocardiogram repeated July 2017 and showed normal left ventricular systolic function, grade 2 diastolic dysfunction, mild aortic stenosis with mean gradient 16 mmHg, trace aortic insufficiency, mild mitral regurgitation and mild left atrial enlargement. Since last seen the patient denies any dyspnea on exertion, orthopnea, PND, pedal edema, palpitations, syncope or chest pain.   Current Outpatient Prescriptions  Medication Sig Dispense Refill  . acetaminophen (TYLENOL) 650 MG CR tablet Take 650 mg by mouth every 8 (eight) hours as needed for pain.    Marland Kitchen amLODipine (NORVASC) 10 MG tablet Take 1 tablet (10 mg total) by mouth daily. 180 tablet 2  . aspirin EC 81 MG tablet Take 81 mg by mouth daily.    Marland Kitchen atorvastatin (LIPITOR) 20 MG tablet Take 20 mg by mouth daily.    Marland Kitchen BIOTIN PO Take 500 mg by mouth daily.    . calcium-vitamin D (OSCAL WITH D) 500-200 MG-UNIT per tablet Take 1 tablet by mouth 2 (two) times daily.    . cholecalciferol (VITAMIN D) 1000 UNITS tablet Take 1,000 Units by mouth daily.    . Coenzyme Q10 (CO Q 10 PO) Take 300 mg by mouth daily.    . Glucosamine-Chondroit-Vit C-Mn (GLUCOSAMINE 1500 COMPLEX PO) Take 1,500 mg by mouth 2 (two) times daily.    . hydrochlorothiazide (HYDRODIURIL) 25 MG tablet Take 25 mg by mouth daily.    Marland Kitchen lisinopril (PRINIVIL,ZESTRIL) 5 MG tablet Take 5 mg by mouth daily.    . metoprolol succinate (TOPROL-XL) 25 MG 24 hr tablet TAKE 1 TABLET (25 MG TOTAL) BY MOUTH DAILY. 90 tablet 2  . Multiple Vitamins-Minerals (ICAPS PO) Take 1 tablet by mouth 2 (two) times daily.    . Omega 3 1200 MG CAPS Take 1,400 mg by mouth daily.     Marland Kitchen zolpidem (AMBIEN) 10 MG tablet Take 10 mg by mouth at bedtime as needed for  sleep.      No current facility-administered medications for this visit.      Past Medical History:  Diagnosis Date  . Aortic stenosis   . Hyperlipidemia   . Hypertension   . Macular degeneration of both eyes    DRY, RIGHT EYE WORSE THAN LEFT    Past Surgical History:  Procedure Laterality Date  . CHOLECYSTECTOMY  10 YRS AGO  . COLONOSCOPY WITH PROPOFOL N/A 05/07/2014   Procedure: COLONOSCOPY WITH PROPOFOL;  Surgeon: Garlan Fair, MD;  Location: [facility];  Service: Endoscopy;  Laterality: N/A;  . EYE SURGERY Bilateral LAST FEW YEARS   LENS REPLACEMENT  FOR CATARACTS  . LAPAROSCOPY  YRS AGO  . TONSILLECTOMY  AS CHILD    Social History   Social History  . Marital status: Married    Spouse name: N/A  . Number of children: 1  . Years of education: N/A   Occupational History  . Not on file.   Social History Main Topics  . Smoking status: Former Smoker    Packs/day: 1.00    Types: Cigarettes    Quit date: 11/08/1992  . Smokeless tobacco: Never Used  . Alcohol use 0.0 oz/week     Comment: RARE  . Drug use: No  . Sexual activity: Not on file   Other Topics Concern  . Not  on file   Social History Narrative  . No narrative on file    Family History  Problem Relation Age of Onset  . Cancer Mother   . CAD Mother        CABG 71s  . COPD Father     ROS: no fevers or chills, productive cough, hemoptysis, dysphasia, odynophagia, melena, hematochezia, dysuria, hematuria, rash, seizure activity, orthopnea, PND, pedal edema, claudication. Remaining systems are negative.  Physical Exam: Well-developed well-nourished in no acute distress.  Skin is warm and dry.  HEENT is normal.  Neck is supple.  Chest is clear to auscultation with normal expansion.  Cardiovascular exam is regular rate and rhythm. 2/6 systolic murmur left sternal border. S2 is not diminished. Abdominal exam nontender or distended. No masses palpated. Extremities show no edema. neuro grossly  intact  ECG- Sinus rhythm at a rate of 66. No ST changes. personally reviewed  A/P  1 aortic stenosis-patient remains asymptomatic. Aortic stenosis was mild on last echocardiogram. It does not sound severe on physical examination today. We will plan to repeat her echocardiogram when she returns in 1 year. I have explained symptoms to be aware of including increasing dyspnea, chest pain or syncope.  2 palpitations-symptoms are well controlled. Continue beta blocker.  3 hyperlipidemia-continue statin.  4 hypertension-blood pressure is mildly elevated. However she brought records of her blood pressure at home and it is typically control. Continue present medications and follow.  Kirk Ruths, MD

## 2017-07-13 ENCOUNTER — Encounter: Payer: Self-pay | Admitting: Cardiology

## 2017-07-13 ENCOUNTER — Ambulatory Visit (INDEPENDENT_AMBULATORY_CARE_PROVIDER_SITE_OTHER): Payer: Medicare Other | Admitting: Cardiology

## 2017-07-13 VITALS — BP 153/72 | HR 66 | Ht 65.0 in | Wt 163.2 lb

## 2017-07-13 DIAGNOSIS — I10 Essential (primary) hypertension: Secondary | ICD-10-CM | POA: Diagnosis not present

## 2017-07-13 DIAGNOSIS — I350 Nonrheumatic aortic (valve) stenosis: Secondary | ICD-10-CM

## 2017-07-13 DIAGNOSIS — R002 Palpitations: Secondary | ICD-10-CM | POA: Diagnosis not present

## 2017-07-13 DIAGNOSIS — E7800 Pure hypercholesterolemia, unspecified: Secondary | ICD-10-CM

## 2017-07-13 NOTE — Patient Instructions (Signed)
Your physician wants you to follow-up in: ONE YEAR WITH DR CRENSHAW You will receive a reminder letter in the mail two months in advance. If you don't receive a letter, please call our office to schedule the follow-up appointment.   If you need a refill on your cardiac medications before your next appointment, please call your pharmacy.  

## 2017-07-25 ENCOUNTER — Ambulatory Visit
Admission: RE | Admit: 2017-07-25 | Discharge: 2017-07-25 | Disposition: A | Discharge status: Home / Self Care | Payer: Medicare Other | Source: Ambulatory Visit | Attending: Obstetrics and Gynecology | Admitting: Obstetrics and Gynecology

## 2017-07-25 DIAGNOSIS — Z1231 Encounter for screening mammogram for malignant neoplasm of breast: Secondary | ICD-10-CM

## 2017-09-09 IMAGING — CR DG LUMBAR SPINE 2-3V
3 series · 3 of 3 positions shown · non-contrast
Comparison: 03/16/2011

CLINICAL DATA: Intermittent chronic low back pain, worse since
[REDACTED]

EXAM:
LUMBAR SPINE - 2-3 VIEW

[view not recorded (1 of 3)]
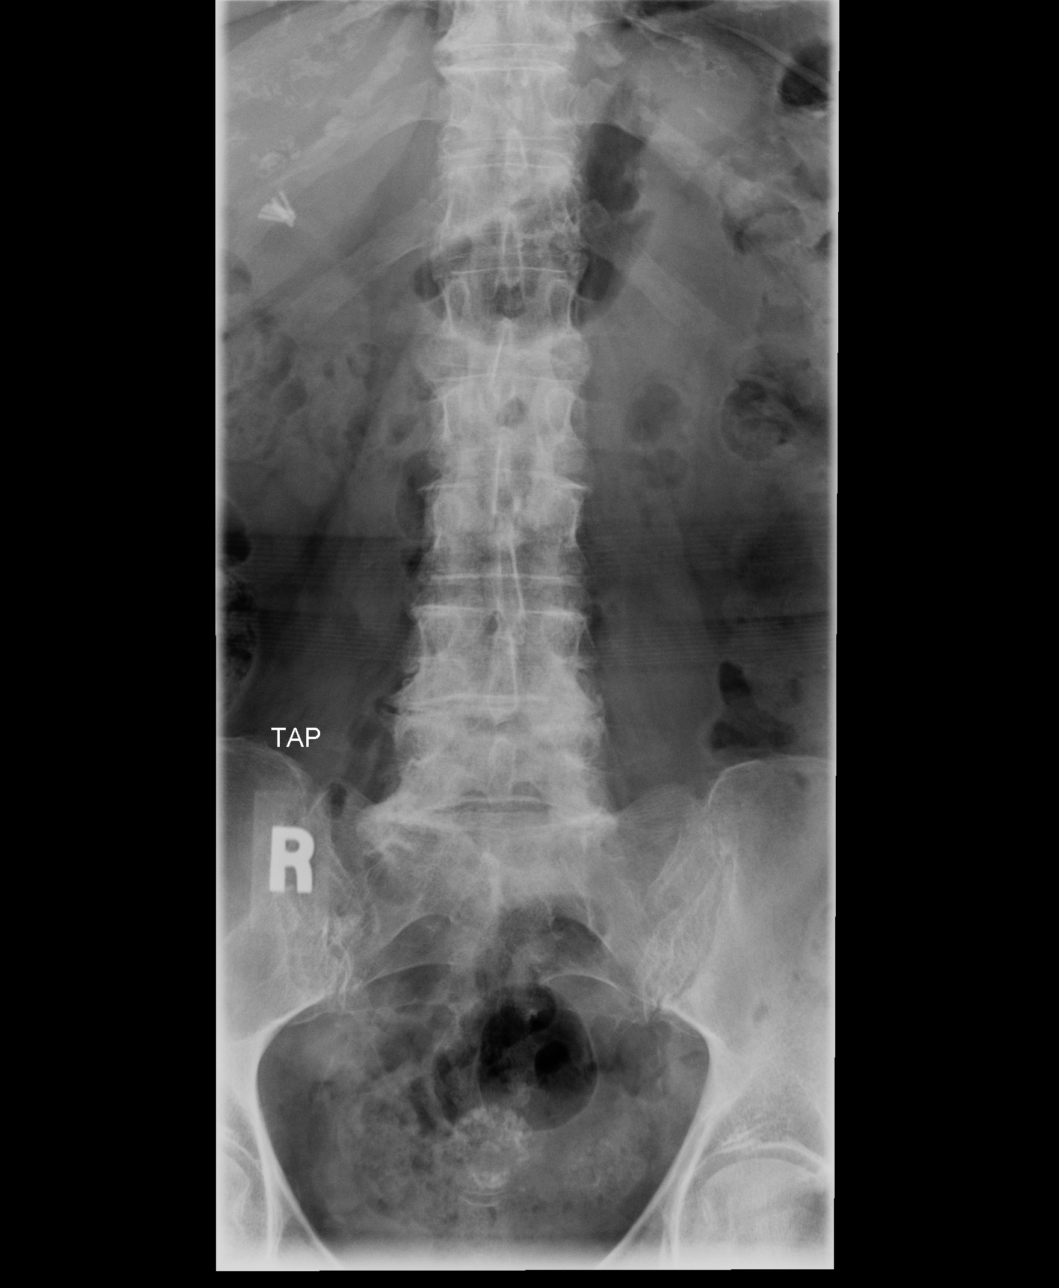

[view not recorded (2 of 3)]
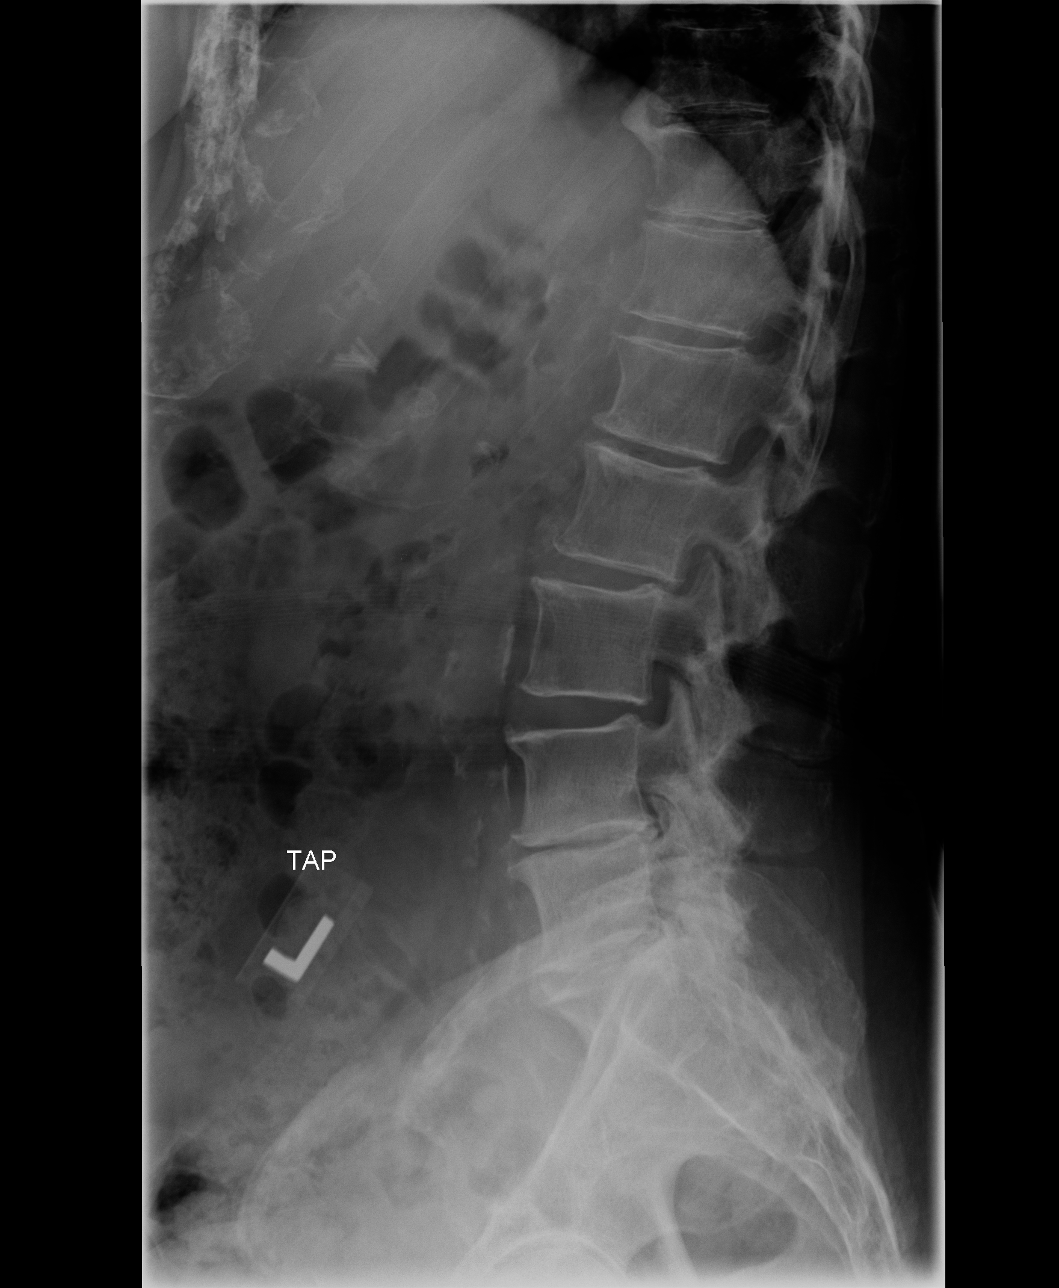

[view not recorded (3 of 3)]
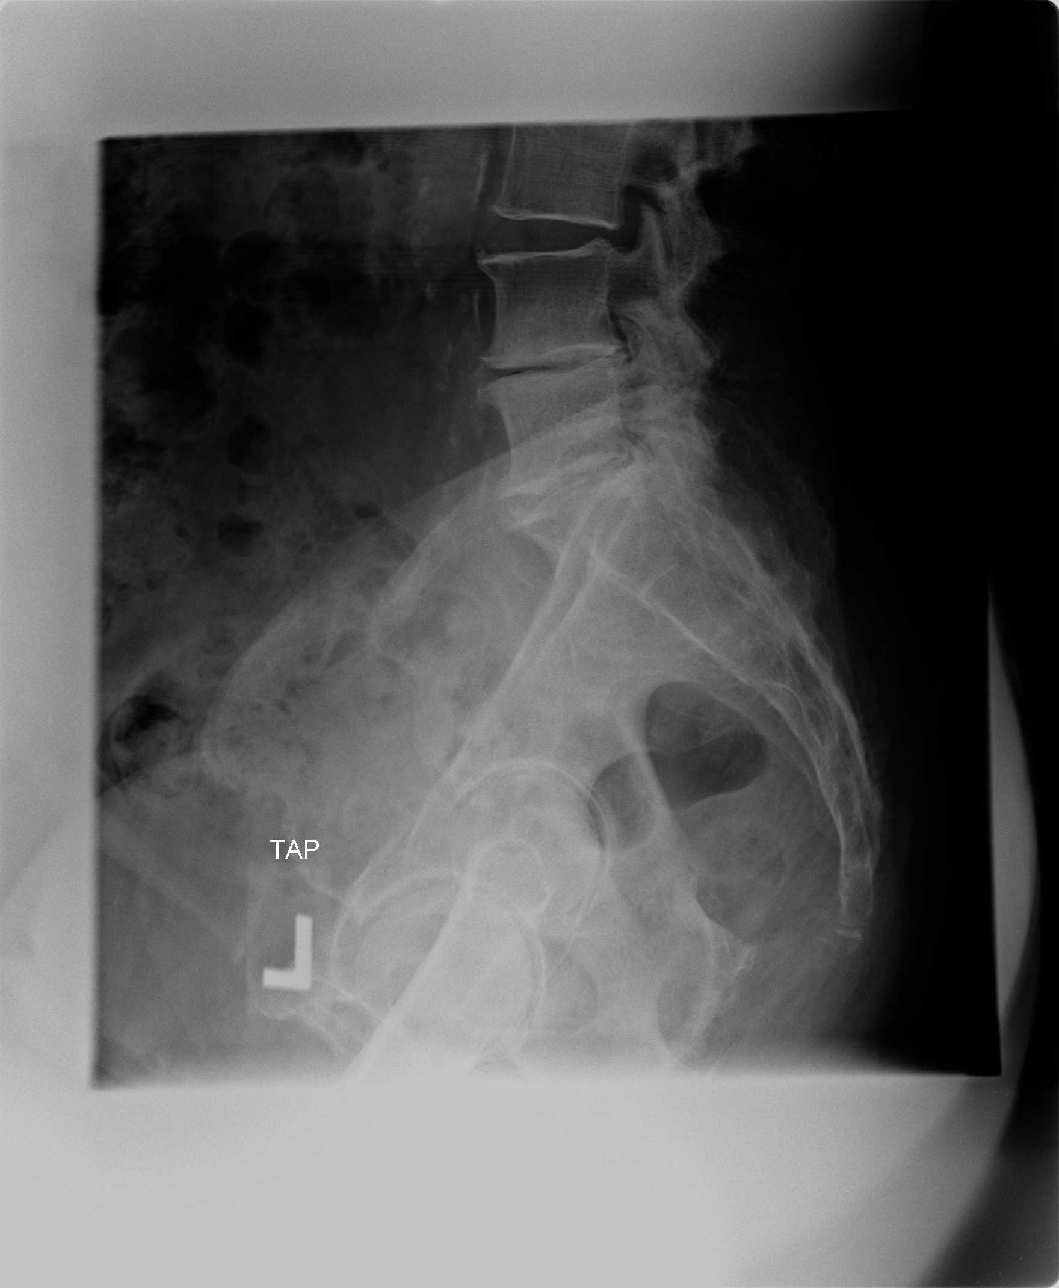

[3 of 3 positions shown; findings below may reference images not displayed]

FINDINGS: Preserved lumbar spine alignment. Progressive advanced degenerative
change and spondylosis at L4-5 and L5-S1 with disc space narrowing,
sclerosis and osteophytes. There is associated facet arthropathy
from L3-S1. Preserved vertebral body heights. No acute compression
fracture, wedge-shaped deformity or focal kyphosis.

Aortoiliac atherosclerosis noted. Normal bowel gas pattern. Prior
cholecystectomy present.
IMPRESSION: Progressive lumbar spondylosis and degenerative change most
pronounced at L4-5 and L5-S1 as above.

No acute osseous finding by plain radiography

## 2017-10-17 ENCOUNTER — Other Ambulatory Visit: Payer: Self-pay | Admitting: Cardiology

## 2017-11-16 DIAGNOSIS — I10 Essential (primary) hypertension: Secondary | ICD-10-CM | POA: Diagnosis not present

## 2017-11-16 DIAGNOSIS — Z79899 Other long term (current) drug therapy: Secondary | ICD-10-CM | POA: Diagnosis not present

## 2017-12-26 DIAGNOSIS — H35423 Microcystoid degeneration of retina, bilateral: Secondary | ICD-10-CM | POA: Diagnosis not present

## 2017-12-26 DIAGNOSIS — H353211 Exudative age-related macular degeneration, right eye, with active choroidal neovascularization: Secondary | ICD-10-CM | POA: Diagnosis not present

## 2017-12-26 DIAGNOSIS — H353122 Nonexudative age-related macular degeneration, left eye, intermediate dry stage: Secondary | ICD-10-CM | POA: Diagnosis not present

## 2017-12-26 DIAGNOSIS — H43813 Vitreous degeneration, bilateral: Secondary | ICD-10-CM | POA: Diagnosis not present

## 2017-12-29 ENCOUNTER — Other Ambulatory Visit: Payer: Self-pay | Admitting: Cardiology

## 2018-01-23 ENCOUNTER — Ambulatory Visit (INDEPENDENT_AMBULATORY_CARE_PROVIDER_SITE_OTHER): Payer: PPO | Admitting: Physician Assistant

## 2018-01-23 ENCOUNTER — Encounter: Payer: Self-pay | Admitting: Physician Assistant

## 2018-01-23 VITALS — BP 138/60 | HR 70 | Ht 65.0 in | Wt 162.2 lb

## 2018-01-23 DIAGNOSIS — R5383 Other fatigue: Secondary | ICD-10-CM | POA: Diagnosis not present

## 2018-01-23 DIAGNOSIS — I350 Nonrheumatic aortic (valve) stenosis: Secondary | ICD-10-CM

## 2018-01-23 DIAGNOSIS — I10 Essential (primary) hypertension: Secondary | ICD-10-CM | POA: Diagnosis not present

## 2018-01-23 DIAGNOSIS — E785 Hyperlipidemia, unspecified: Secondary | ICD-10-CM

## 2018-01-23 NOTE — Progress Notes (Signed)
Cardiology Office Note    Date:  01/24/2018   ID:  [name], DOB 1941-03-24, MRN [id number]  PCP:  Lajean Manes, MD  Cardiologist:  Dr. Stanford Breed   Chief Complaint  Patient presents with  . Follow-up    seen for Dr. Stanford Breed    History of Present Illness:  [name] is a 77 y.o. female with PMH of HTN, HLD, aortic stenosis and palpitation.  Cardiac catheterization performed in May 2012 showed a normal LV function, no obstructive disease, mild AS.  Holter monitor obtained in July 2016 showed sinus rhythm with first-degree AV block, PACs, PVCs and brief PAT.  Echocardiogram in July 2017 showed normal LV systolic function, grade 2 DD, mild aortic stenosis with mean gradient 16 mmHg, mild MR.  She was last seen by Dr. Stanford Breed on 07/13/2017 at which time she denies any significant chest pain, dyspnea or syncope.  Patient presents today for evaluation of fatigue which has since resolved.  According to the patient, she continued to exercise with her friends and denies any exertional chest pain or shortness of breath.  She continued to have 1-2/6 heart murmur on physical exam, she is due for repeat echocardiogram later this year prior to her follow-up with Dr. Stanford Breed.  My suspicion of her symptoms relating to heart valve issue is very low.  Physical exam of her heart murmur does not suggest severe aortic stenosis.  Interestingly enough, her symptom has self resolved since last week.  I plan to obtain a CBC, TSH and basic metabolic panel to rule out potential secondary causes.  Otherwise I would not recommend any further workup at this time.  She does not have any lower extremity edema, orthopnea or PND.  Based on her home blood pressure and heart rate, her blood pressure has been in the 120-130s at home.  Her heart rate is also quite stable.  She does not have any cardiac awareness of any palpitation, however occasionally have a uneasy feeling at night when she is lying down.  I will hold off  on increasing the beta-blocker at this time unless her symptoms progress.  I did obtain a EKG today, however her heart rate is very regular on physical exam.   Past Medical History:  Diagnosis Date  . Aortic stenosis   . Hyperlipidemia   . Hypertension   . Macular degeneration of both eyes    DRY, RIGHT EYE WORSE THAN LEFT    Past Surgical History:  Procedure Laterality Date  . CHOLECYSTECTOMY  10 YRS AGO  . COLONOSCOPY WITH PROPOFOL N/A 05/07/2014   Procedure: COLONOSCOPY WITH PROPOFOL;  Surgeon: Garlan Fair, MD;  Location: [facility];  Service: Endoscopy;  Laterality: N/A;  . EYE SURGERY Bilateral LAST FEW YEARS   LENS REPLACEMENT  FOR CATARACTS  . LAPAROSCOPY  YRS AGO  . TONSILLECTOMY  AS CHILD    Current Medications: Outpatient Medications Prior to Visit  Medication Sig Dispense Refill  . acetaminophen (TYLENOL) 650 MG CR tablet Take 650 mg by mouth every 8 (eight) hours as needed for pain.    Marland Kitchen amLODipine (NORVASC) 10 MG tablet TAKE 1 TABLET (10 MG TOTAL) BY MOUTH DAILY. 180 tablet 1  . aspirin EC 81 MG tablet Take 81 mg by mouth daily.    Marland Kitchen atorvastatin (LIPITOR) 20 MG tablet Take 20 mg by mouth daily.    Marland Kitchen BIOTIN PO Take 500 mg by mouth daily.    . calcium-vitamin D (OSCAL WITH D) 500-200  MG-UNIT per tablet Take 1 tablet by mouth 2 (two) times daily.    . cholecalciferol (VITAMIN D) 1000 UNITS tablet Take 1,000 Units by mouth daily.    . Coenzyme Q10 (CO Q 10 PO) Take 300 mg by mouth daily.    . Glucosamine-Chondroit-Vit C-Mn (GLUCOSAMINE 1500 COMPLEX PO) Take 1,500 mg by mouth 2 (two) times daily.    . hydrochlorothiazide (HYDRODIURIL) 25 MG tablet Take 25 mg by mouth daily.    Marland Kitchen lisinopril (PRINIVIL,ZESTRIL) 5 MG tablet Take 5 mg by mouth daily.    . metoprolol succinate (TOPROL-XL) 25 MG 24 hr tablet TAKE 1 TABLET (25 MG TOTAL) BY MOUTH DAILY. 90 tablet 1  . Multiple Vitamins-Minerals (ICAPS PO) Take 1 tablet by mouth 2 (two) times daily.    . Omega 3 1200 MG  CAPS Take 1,400 mg by mouth daily.     Marland Kitchen zolpidem (AMBIEN) 10 MG tablet Take 10 mg by mouth at bedtime as needed for sleep.      No facility-administered medications prior to visit.      Allergies:   Codeine   Social History   Socioeconomic History  . Marital status: Married    Spouse name: None  . Number of children: 1  . Years of education: None  . Highest education level: None  Social Needs  . Financial resource strain: None  . Food insecurity - worry: None  . Food insecurity - inability: None  . Transportation needs - medical: None  . Transportation needs - non-medical: None  Occupational History  . None  Tobacco Use  . Smoking status: Former Smoker    Packs/day: 1.00    Types: Cigarettes    Last attempt to quit: 11/08/1992    Years since quitting: 25.2  . Smokeless tobacco: Never Used  Substance and Sexual Activity  . Alcohol use: Yes    Alcohol/week: 0.0 oz    Comment: RARE  . Drug use: No  . Sexual activity: None  Other Topics Concern  . None  Social History Narrative  . None     Family History:  The patient's family history includes CAD in her mother; COPD in her father; Cancer in her mother.   ROS:   Please see the history of present illness.    ROS All other systems reviewed and are negative.   PHYSICAL EXAM:   VS:  BP 138/60   Pulse 70   Ht 5\' 5"  (1.651 m)   Wt 162 lb 3.2 oz (73.6 kg)   SpO2 97%   BMI 26.99 kg/m    GEN: Well nourished, well developed, in no acute distress  HEENT: normal  Neck: no JVD, carotid bruits, or masses Cardiac: RRR; no rubs, or gallops,no edema  +5/9 systolic murmur at RUSB Respiratory:  clear to auscultation bilaterally, normal work of breathing GI: soft, nontender, nondistended, + BS MS: no deformity or atrophy  Skin: warm and dry, no rash Neuro:  Alert and Oriented x 3, Strength and sensation are intact Psych: euthymic mood, full affect  Wt Readings from Last 3 Encounters:  01/23/18 162 lb 3.2 oz (73.6 kg)    07/13/17 163 lb 3.2 oz (74 kg)  07/05/16 164 lb (74.4 kg)      Studies/Labs Reviewed:   EKG:  EKG is not ordered today.  Heart rate very regular on physical exam  Recent Labs: 01/23/2018: BUN 17; Creatinine, Ser 0.67; Hemoglobin 13.7; Platelets 329; Potassium 4.4; Sodium 144; TSH 1.920   Lipid Panel  Component Value Date/Time   CHOL 163 03/12/2011 0310   TRIG 106 03/12/2011 0310   HDL 68 03/12/2011 0310   CHOLHDL 2.4 03/12/2011 0310   VLDL 21 03/12/2011 0310   LDLCALC  03/12/2011 0310    74        Total Cholesterol/HDL:CHD Risk Coronary Heart Disease Risk Table                     Men   Women  1/2 Average Risk   3.4   3.3  Average Risk       5.0   4.4  2 X Average Risk   9.6   7.1  3 X Average Risk  23.4   11.0        Use the calculated Patient Ratio above and the CHD Risk Table to determine the patient's CHD Risk.        ATP III CLASSIFICATION (LDL):  <100     mg/dL   Optimal  100-129  mg/dL   Near or Above                    Optimal  130-159  mg/dL   Borderline  160-189  mg/dL   High  >190     mg/dL   Very High    Additional studies/ records that were reviewed today include:   Echo 05/31/2016 LV EF: 60% -   65% Study Conclusions  - Left ventricle: The cavity size was normal. Wall thickness was   normal. Systolic function was normal. The estimated ejection   fraction was in the range of 60% to 65%. Features are consistent   with a pseudonormal left ventricular filling pattern, with   concomitant abnormal relaxation and increased filling pressure   (grade 2 diastolic dysfunction). - Aortic valve: AV is difficult to see well It is thickened,   calcified with mildly restricted motion Peak and mean gradients   through the valve are 31 and 16 mm Hg respectively. There was   trivial regurgitation. - Mitral valve: Calcified annulus. Mildly thickened leaflets .   There was mild regurgitation. - Left atrium: The atrium was mildly dilated.    ASSESSMENT:     1. Fatigue, unspecified type   2. Nonrheumatic aortic valve stenosis   3. Essential hypertension   4. Hyperlipidemia, unspecified hyperlipidemia type      PLAN:  In order of problems listed above:  1. Fatigue: Symptom occurred last week and this self resolved.  Denies any recent chest pain.  No ischemic workup at this time.  Will obtain CBC, basic metabolic panel and TSH to rule out secondary causes  2. Aortic stenosis: Seen on previous echocardiogram in 2017, physical exam shows very mild heart murmur, suspicion for severe aortic stenosis very low.  She is due to have a repeat echocardiogram in September of this year before her next follow-up with Dr. Stanford Breed  3. Hypertension: Blood pressure well controlled  4. Hyperlipidemia: Continue Lipitor 20 mg daily, lipid panel monitored by primary care provider    Medication Adjustments/Labs and Tests Ordered: Current medicines are reviewed at length with the patient today.  Concerns regarding medicines are outlined above.  Medication changes, Labs and Tests ordered today are listed in the Patient Instructions below. Patient Instructions  Medication Instructions:  Your physician recommends that you continue on your current medications as directed. Please refer to the Current Medication list given to you today.  Labwork: Your physician recommends that you return  for lab work in: [employer], TSH, BMET  Testing/Procedures: Your physician has requested that you have an echocardiogram. Echocardiography is a painless test that uses sound waves to create images of your heart. It provides your doctor with information about the size and shape of your heart and how well your heart's chambers and valves are working. This procedure takes approximately one hour. There are no restrictions for this procedure. [place] [address]    Follow-Up: Your physician wants you to follow-up in: 6 months with Dr Stanford Breed. You will receive a reminder  letter in the mail two months in advance. If you don't receive a letter, please call our office to schedule the follow-up appointment.  Any Other Special Instructions Will Be Listed Below (If Applicable). If you need a refill on your cardiac medications before your next appointment, please call your pharmacy.     Hilbert Corrigan, [place]  01/24/2018 11:08 PM    [facility] [place], [place],   [address] Phone: [phone]; Fax: [phone]

## 2018-01-23 NOTE — Patient Instructions (Addendum)
Medication Instructions:  Your physician recommends that you continue on your current medications as directed. Please refer to the Current Medication list given to you today.  Labwork: Your physician recommends that you return for lab work in: [employer], TSH, BMET  Testing/Procedures: Your physician has requested that you have an echocardiogram. Echocardiography is a painless test that uses sound waves to create images of your heart. It provides your doctor with information about the size and shape of your heart and how well your heart's chambers and valves are working. This procedure takes approximately one hour. There are no restrictions for this procedure. [place] [address]    Follow-Up: Your physician wants you to follow-up in: 6 months with Dr Stanford Breed. You will receive a reminder letter in the mail two months in advance. If you don't receive a letter, please call our office to schedule the follow-up appointment.  Any Other Special Instructions Will Be Listed Below (If Applicable). If you need a refill on your cardiac medications before your next appointment, please call your pharmacy.

## 2018-01-24 ENCOUNTER — Encounter: Payer: Self-pay | Admitting: Physician Assistant

## 2018-01-24 LAB — BASIC METABOLIC PANEL
BUN/Creatinine Ratio: 25 (ref 12–28)
BUN: 17 mg/dL (ref 8–27)
CALCIUM: 10.1 mg/dL (ref 8.7–10.3)
CHLORIDE: 102 mmol/L (ref 96–106)
CO2: 23 mmol/L (ref 20–29)
Creatinine, Ser: 0.67 mg/dL (ref 0.57–1.00)
GFR calc non Af Amer: 85 mL/min/{1.73_m2} (ref >59)
GFR, EST AFRICAN AMERICAN: 98 mL/min/{1.73_m2} (ref >59)
Glucose: 99 mg/dL (ref 65–99)
POTASSIUM: 4.4 mmol/L (ref 3.5–5.2)
Sodium: 144 mmol/L (ref 134–144)

## 2018-01-24 LAB — CBC
Hematocrit: 40.6 % (ref 34.0–46.6)
Hemoglobin: 13.7 g/dL (ref 11.1–15.9)
MCH: 30.7 pg (ref 26.6–33.0)
MCHC: 33.7 g/dL (ref 31.5–35.7)
MCV: 91 fL (ref 79–97)
PLATELETS: 329 10*3/uL (ref 150–379)
RBC: 4.46 x10E6/uL (ref 3.77–5.28)
RDW: 13.8 % (ref 12.3–15.4)
WBC: 7.5 10*3/uL (ref 3.4–10.8)

## 2018-01-24 LAB — TSH: TSH: 1.92 u[IU]/mL (ref 0.450–4.500)

## 2018-01-27 NOTE — Progress Notes (Signed)
Red blood cell count, thyroid, kidney function and electrolyte normal. No obvious secondary causes to explain her episodic fatigue

## 2018-02-13 DIAGNOSIS — H353211 Exudative age-related macular degeneration, right eye, with active choroidal neovascularization: Secondary | ICD-10-CM | POA: Diagnosis not present

## 2018-02-13 DIAGNOSIS — H353122 Nonexudative age-related macular degeneration, left eye, intermediate dry stage: Secondary | ICD-10-CM | POA: Diagnosis not present

## 2018-02-13 DIAGNOSIS — H35423 Microcystoid degeneration of retina, bilateral: Secondary | ICD-10-CM | POA: Diagnosis not present

## 2018-02-13 DIAGNOSIS — H43813 Vitreous degeneration, bilateral: Secondary | ICD-10-CM | POA: Diagnosis not present

## 2018-03-27 DIAGNOSIS — H353122 Nonexudative age-related macular degeneration, left eye, intermediate dry stage: Secondary | ICD-10-CM | POA: Diagnosis not present

## 2018-03-27 DIAGNOSIS — H43813 Vitreous degeneration, bilateral: Secondary | ICD-10-CM | POA: Diagnosis not present

## 2018-03-27 DIAGNOSIS — H353211 Exudative age-related macular degeneration, right eye, with active choroidal neovascularization: Secondary | ICD-10-CM | POA: Diagnosis not present

## 2018-03-27 DIAGNOSIS — H35423 Microcystoid degeneration of retina, bilateral: Secondary | ICD-10-CM | POA: Diagnosis not present

## 2018-04-17 DIAGNOSIS — I471 Supraventricular tachycardia: Secondary | ICD-10-CM | POA: Diagnosis not present

## 2018-04-17 DIAGNOSIS — E7800 Pure hypercholesterolemia, unspecified: Secondary | ICD-10-CM | POA: Diagnosis not present

## 2018-04-17 DIAGNOSIS — Z0000 Encounter for general adult medical examination without abnormal findings: Secondary | ICD-10-CM | POA: Diagnosis not present

## 2018-04-17 DIAGNOSIS — Z79899 Other long term (current) drug therapy: Secondary | ICD-10-CM | POA: Diagnosis not present

## 2018-04-17 DIAGNOSIS — F5101 Primary insomnia: Secondary | ICD-10-CM | POA: Diagnosis not present

## 2018-04-17 DIAGNOSIS — I350 Nonrheumatic aortic (valve) stenosis: Secondary | ICD-10-CM | POA: Diagnosis not present

## 2018-04-17 DIAGNOSIS — I10 Essential (primary) hypertension: Secondary | ICD-10-CM | POA: Diagnosis not present

## 2018-04-17 DIAGNOSIS — Z1389 Encounter for screening for other disorder: Secondary | ICD-10-CM | POA: Diagnosis not present

## 2018-04-17 DIAGNOSIS — Z7189 Other specified counseling: Secondary | ICD-10-CM | POA: Diagnosis not present

## 2018-05-01 ENCOUNTER — Other Ambulatory Visit: Payer: Self-pay | Admitting: Cardiology

## 2018-05-01 NOTE — Telephone Encounter (Signed)
Rx request sent to pharmacy.  

## 2018-05-15 DIAGNOSIS — H35423 Microcystoid degeneration of retina, bilateral: Secondary | ICD-10-CM | POA: Diagnosis not present

## 2018-05-15 DIAGNOSIS — H43813 Vitreous degeneration, bilateral: Secondary | ICD-10-CM | POA: Diagnosis not present

## 2018-05-15 DIAGNOSIS — H353122 Nonexudative age-related macular degeneration, left eye, intermediate dry stage: Secondary | ICD-10-CM | POA: Diagnosis not present

## 2018-05-15 DIAGNOSIS — H353211 Exudative age-related macular degeneration, right eye, with active choroidal neovascularization: Secondary | ICD-10-CM | POA: Diagnosis not present

## 2018-05-30 ENCOUNTER — Other Ambulatory Visit: Payer: Self-pay | Admitting: Obstetrics and Gynecology

## 2018-05-30 DIAGNOSIS — Z1231 Encounter for screening mammogram for malignant neoplasm of breast: Secondary | ICD-10-CM

## 2018-06-19 DIAGNOSIS — I10 Essential (primary) hypertension: Secondary | ICD-10-CM | POA: Diagnosis not present

## 2018-06-28 DIAGNOSIS — K648 Other hemorrhoids: Secondary | ICD-10-CM | POA: Diagnosis not present

## 2018-06-30 ENCOUNTER — Ambulatory Visit (HOSPITAL_COMMUNITY): Payer: PPO | Attending: Cardiology

## 2018-06-30 ENCOUNTER — Other Ambulatory Visit: Payer: Self-pay

## 2018-06-30 DIAGNOSIS — I350 Nonrheumatic aortic (valve) stenosis: Secondary | ICD-10-CM | POA: Diagnosis not present

## 2018-07-03 DIAGNOSIS — H43813 Vitreous degeneration, bilateral: Secondary | ICD-10-CM | POA: Diagnosis not present

## 2018-07-03 DIAGNOSIS — H35423 Microcystoid degeneration of retina, bilateral: Secondary | ICD-10-CM | POA: Diagnosis not present

## 2018-07-03 DIAGNOSIS — H353211 Exudative age-related macular degeneration, right eye, with active choroidal neovascularization: Secondary | ICD-10-CM | POA: Diagnosis not present

## 2018-07-03 DIAGNOSIS — H353122 Nonexudative age-related macular degeneration, left eye, intermediate dry stage: Secondary | ICD-10-CM | POA: Diagnosis not present

## 2018-07-13 NOTE — Progress Notes (Signed)
HPI: FU AS and palpitations; cardiac catheterization May 2012 showed normal LV function and no obstructive coronary disease; mild AS with AVA 1.2 cm2. Holter monitor July 2016 showed sinus rhythm with first-degree AV block, PACs, PVCs and brief PAT. Last echocardiogram August 2019 showed normal LV function, moderate diastolic dysfunction, moderate aortic stenosis (mean gradient 17 mmHg), mild aortic insufficiency, mild mitral regurgitation and mildly elevated pulmonary pressure.  Since last seen the patient has dyspnea with more extreme activities but not with routine activities. It is relieved with rest. It is not associated with chest pain. There is no orthopnea, PND or pedal edema. There is no syncope or palpitations. There is no exertional chest pain.   Current Outpatient Medications  Medication Sig Dispense Refill  . acetaminophen (TYLENOL) 650 MG CR tablet Take 650 mg by mouth every 8 (eight) hours as needed for pain.    Marland Kitchen amLODipine (NORVASC) 10 MG tablet TAKE 1 TABLET (10 MG TOTAL) BY MOUTH DAILY. 180 tablet 1  . atorvastatin (LIPITOR) 20 MG tablet Take 20 mg by mouth daily.    . calcium-vitamin D (OSCAL WITH D) 500-200 MG-UNIT per tablet Take 1 tablet by mouth 2 (two) times daily.    . cholecalciferol (VITAMIN D) 1000 UNITS tablet Take 1,000 Units by mouth daily.    . Coenzyme Q10 (CO Q 10 PO) Take 300 mg by mouth daily.    . Glucosamine-Chondroit-Vit C-Mn (GLUCOSAMINE 1500 COMPLEX PO) Take 1,500 mg by mouth 2 (two) times daily.    . hydrochlorothiazide (HYDRODIURIL) 25 MG tablet Take 25 mg by mouth daily.    Marland Kitchen lisinopril (PRINIVIL,ZESTRIL) 10 MG tablet Take 10 mg by mouth daily.    . metoprolol succinate (TOPROL-XL) 25 MG 24 hr tablet TAKE 1 TABLET (25 MG TOTAL) BY MOUTH DAILY. 90 tablet 1  . Multiple Vitamins-Minerals (ICAPS PO) Take 1 tablet by mouth 2 (two) times daily.    . Omega 3 1200 MG CAPS Take 1,400 mg by mouth daily.     Marland Kitchen zolpidem (AMBIEN) 10 MG tablet Take 10 mg by  mouth at bedtime as needed for sleep.      No current facility-administered medications for this visit.      Past Medical History:  Diagnosis Date  . Aortic stenosis   . Essential hypertension 05/30/2015  . Hyperlipidemia   . Hypertension   . Irregular heartbeat 06/02/2015  . Macular degeneration of both eyes    DRY, RIGHT EYE WORSE THAN LEFT  . Palpitations 05/30/2015  . PSVT (paroxysmal supraventricular tachycardia) ([place]) 07/23/2015  . PVC's (premature ventricular contractions) 07/23/2015    Past Surgical History:  Procedure Laterality Date  . CHOLECYSTECTOMY  10 YRS AGO  . COLONOSCOPY WITH PROPOFOL N/A 05/07/2014   Procedure: COLONOSCOPY WITH PROPOFOL;  Surgeon: Garlan Fair, MD;  Location: [facility];  Service: Endoscopy;  Laterality: N/A;  . EYE SURGERY Bilateral LAST FEW YEARS   LENS REPLACEMENT  FOR CATARACTS  . LAPAROSCOPY  YRS AGO  . TONSILLECTOMY  AS CHILD    Social History   Socioeconomic History  . Marital status: Married    Spouse name: Not on file  . Number of children: 1  . Years of education: Not on file  . Highest education level: Not on file  Occupational History  . Not on file  Social Needs  . Financial resource strain: Not on file  . Food insecurity:    Worry: Not on file    Inability: Not on  file  . Transportation needs:    Medical: Not on file    Non-medical: Not on file  Tobacco Use  . Smoking status: Former Smoker    Packs/day: 1.00    Types: Cigarettes    Last attempt to quit: 11/08/1992    Years since quitting: 25.7  . Smokeless tobacco: Never Used  Substance and Sexual Activity  . Alcohol use: Yes    Alcohol/week: 0.0 standard drinks    Comment: RARE  . Drug use: No  . Sexual activity: Not on file  Lifestyle  . Physical activity:    Days per week: Not on file    Minutes per session: Not on file  . Stress: Not on file  Relationships  . Social connections:    Talks on phone: Not on file    Gets together: Not on file     Attends religious service: Not on file    Active member of club or organization: Not on file    Attends meetings of clubs or organizations: Not on file    Relationship status: Not on file  . Intimate partner violence:    Fear of current or ex partner: Not on file    Emotionally abused: Not on file    Physically abused: Not on file    Forced sexual activity: Not on file  Other Topics Concern  . Not on file  Social History Narrative  . Not on file    Family History  Problem Relation Age of Onset  . Cancer Mother   . CAD Mother        CABG 81s  . COPD Father     ROS: no fevers or chills, productive cough, hemoptysis, dysphasia, odynophagia, melena, hematochezia, dysuria, hematuria, rash, seizure activity, orthopnea, PND, pedal edema, claudication. Remaining systems are negative.  Physical Exam: Well-developed well-nourished in no acute distress.  Skin is warm and dry.  HEENT is normal.  Neck is supple.  Chest is clear to auscultation with normal expansion.  Cardiovascular exam is regular rate and rhythm.  2/6 systolic murmur left sternal border.  S2 is not diminished. Abdominal exam nontender or distended. No masses palpated. Extremities show no edema. neuro grossly intact  ECG-sinus rhythm at a rate of 69.  No ST changes.  Personally reviewed  A/P  1 aortic stenosis-mild to moderate on most recent echocardiogram.  We will plan follow-up echocardiogram August 2020.  She understands symptoms to be aware of including dyspnea, chest pain and syncope.  2 hypertension-blood pressure is controlled.  Continue present medications.  3 hyperlipidemia-continue statin.  Lipids and monitored by primary care.  4 palpitations-symptoms are well controlled.  Continue present medications and follow.  Kirk Ruths, MD

## 2018-07-17 ENCOUNTER — Ambulatory Visit (INDEPENDENT_AMBULATORY_CARE_PROVIDER_SITE_OTHER): Payer: PPO | Admitting: Cardiology

## 2018-07-17 ENCOUNTER — Encounter: Payer: Self-pay | Admitting: Cardiology

## 2018-07-17 VITALS — BP 134/60 | HR 69 | Ht 65.0 in | Wt 164.0 lb

## 2018-07-17 DIAGNOSIS — I350 Nonrheumatic aortic (valve) stenosis: Secondary | ICD-10-CM | POA: Diagnosis not present

## 2018-07-17 DIAGNOSIS — R002 Palpitations: Secondary | ICD-10-CM | POA: Diagnosis not present

## 2018-07-17 DIAGNOSIS — E7800 Pure hypercholesterolemia, unspecified: Secondary | ICD-10-CM | POA: Diagnosis not present

## 2018-07-17 DIAGNOSIS — I10 Essential (primary) hypertension: Secondary | ICD-10-CM

## 2018-07-17 NOTE — Patient Instructions (Addendum)
Medication Instructions: Your physician recommends that you continue on your current medications as directed.    If you need a refill on your cardiac medications before your next appointment, please call your pharmacy.   Labwork: None  Procedures/Testing: None   Follow-Up: Your physician wants you to follow-up in 6 months with Dr. Crenshaw. You will receive a reminder letter in the mail two months in advance. If you don't receive a letter, please call our office at [phone] to schedule this follow-up appointment.   Special Instructions:    Thank you for choosing [facility]!!    

## 2018-07-19 DIAGNOSIS — K648 Other hemorrhoids: Secondary | ICD-10-CM | POA: Diagnosis not present

## 2018-08-02 ENCOUNTER — Ambulatory Visit
Admission: RE | Admit: 2018-08-02 | Discharge: 2018-08-02 | Disposition: A | Discharge status: Home / Self Care | Payer: PPO | Source: Ambulatory Visit | Attending: Obstetrics and Gynecology | Admitting: Obstetrics and Gynecology

## 2018-08-02 DIAGNOSIS — Z1231 Encounter for screening mammogram for malignant neoplasm of breast: Secondary | ICD-10-CM

## 2018-08-09 DIAGNOSIS — K648 Other hemorrhoids: Secondary | ICD-10-CM | POA: Diagnosis not present

## 2018-08-16 DIAGNOSIS — Z01419 Encounter for gynecological examination (general) (routine) without abnormal findings: Secondary | ICD-10-CM | POA: Diagnosis not present

## 2018-08-16 DIAGNOSIS — Z6827 Body mass index (BMI) 27.0-27.9, adult: Secondary | ICD-10-CM | POA: Diagnosis not present

## 2018-08-21 DIAGNOSIS — H43813 Vitreous degeneration, bilateral: Secondary | ICD-10-CM | POA: Diagnosis not present

## 2018-08-21 DIAGNOSIS — H353211 Exudative age-related macular degeneration, right eye, with active choroidal neovascularization: Secondary | ICD-10-CM | POA: Diagnosis not present

## 2018-08-21 DIAGNOSIS — H35423 Microcystoid degeneration of retina, bilateral: Secondary | ICD-10-CM | POA: Diagnosis not present

## 2018-08-21 DIAGNOSIS — H353122 Nonexudative age-related macular degeneration, left eye, intermediate dry stage: Secondary | ICD-10-CM | POA: Diagnosis not present

## 2018-08-23 DIAGNOSIS — Z23 Encounter for immunization: Secondary | ICD-10-CM | POA: Diagnosis not present

## 2018-09-01 DIAGNOSIS — D045 Carcinoma in situ of skin of trunk: Secondary | ICD-10-CM | POA: Diagnosis not present

## 2018-09-01 DIAGNOSIS — D1801 Hemangioma of skin and subcutaneous tissue: Secondary | ICD-10-CM | POA: Diagnosis not present

## 2018-09-01 DIAGNOSIS — L720 Epidermal cyst: Secondary | ICD-10-CM | POA: Diagnosis not present

## 2018-09-01 DIAGNOSIS — L570 Actinic keratosis: Secondary | ICD-10-CM | POA: Diagnosis not present

## 2018-09-01 DIAGNOSIS — L565 Disseminated superficial actinic porokeratosis (DSAP): Secondary | ICD-10-CM | POA: Diagnosis not present

## 2018-09-01 DIAGNOSIS — L814 Other melanin hyperpigmentation: Secondary | ICD-10-CM | POA: Diagnosis not present

## 2018-09-01 DIAGNOSIS — L821 Other seborrheic keratosis: Secondary | ICD-10-CM | POA: Diagnosis not present

## 2018-10-09 DIAGNOSIS — H353211 Exudative age-related macular degeneration, right eye, with active choroidal neovascularization: Secondary | ICD-10-CM | POA: Diagnosis not present

## 2018-10-09 DIAGNOSIS — H353122 Nonexudative age-related macular degeneration, left eye, intermediate dry stage: Secondary | ICD-10-CM | POA: Diagnosis not present

## 2018-10-09 DIAGNOSIS — H35031 Hypertensive retinopathy, right eye: Secondary | ICD-10-CM | POA: Diagnosis not present

## 2018-10-09 DIAGNOSIS — H35423 Microcystoid degeneration of retina, bilateral: Secondary | ICD-10-CM | POA: Diagnosis not present

## 2018-10-23 ENCOUNTER — Other Ambulatory Visit: Payer: Self-pay | Admitting: Cardiology

## 2018-10-23 NOTE — Telephone Encounter (Signed)
Rx request sent to pharmacy.  

## 2018-11-27 DIAGNOSIS — H35423 Microcystoid degeneration of retina, bilateral: Secondary | ICD-10-CM | POA: Diagnosis not present

## 2018-11-27 DIAGNOSIS — H353122 Nonexudative age-related macular degeneration, left eye, intermediate dry stage: Secondary | ICD-10-CM | POA: Diagnosis not present

## 2018-11-27 DIAGNOSIS — H35031 Hypertensive retinopathy, right eye: Secondary | ICD-10-CM | POA: Diagnosis not present

## 2018-11-27 DIAGNOSIS — H353211 Exudative age-related macular degeneration, right eye, with active choroidal neovascularization: Secondary | ICD-10-CM | POA: Diagnosis not present

## 2018-12-15 ENCOUNTER — Other Ambulatory Visit: Payer: Self-pay | Admitting: Cardiology

## 2018-12-20 DIAGNOSIS — I10 Essential (primary) hypertension: Secondary | ICD-10-CM | POA: Diagnosis not present

## 2018-12-20 DIAGNOSIS — Z79899 Other long term (current) drug therapy: Secondary | ICD-10-CM | POA: Diagnosis not present

## 2019-01-16 NOTE — Progress Notes (Signed)
HPI: FU AS and palpitations; cardiac catheterization May 2012 showed normal LV function and no obstructive coronary disease; mild AS with AVA 1.2 cm2. Holter monitor July 2016 showed sinus rhythm with first-degree AV block, PACs, PVCs and brief PAT. Last echocardiogram August 2019 showed normal LV function, moderate diastolic dysfunction, moderate aortic stenosis (mean gradient 17 mmHg), mild aortic insufficiency, mild mitral regurgitation and mildly elevated pulmonary pressure.  Since last seenthere is no dyspnea, chest pain, palpitations or syncope.  Current Outpatient Medications  Medication Sig Dispense Refill  . acetaminophen (TYLENOL) 650 MG CR tablet Take 650 mg by mouth every 8 (eight) hours as needed for pain.    Marland Kitchen amLODipine (NORVASC) 10 MG tablet TAKE 1 TABLET (10 MG TOTAL) BY MOUTH DAILY. 90 tablet 3  . atorvastatin (LIPITOR) 20 MG tablet Take 20 mg by mouth daily.    . calcium-vitamin D (OSCAL WITH D) 500-200 MG-UNIT per tablet Take 1 tablet by mouth 2 (two) times daily.    . cholecalciferol (VITAMIN D) 1000 UNITS tablet Take 1,000 Units by mouth daily.    . Coenzyme Q10 (CO Q 10 PO) Take 300 mg by mouth daily.    . Glucosamine-Chondroit-Vit C-Mn (GLUCOSAMINE 1500 COMPLEX PO) Take 1,500 mg by mouth 2 (two) times daily.    . hydrochlorothiazide (HYDRODIURIL) 25 MG tablet Take 25 mg by mouth daily.    Marland Kitchen lisinopril (PRINIVIL,ZESTRIL) 10 MG tablet Take 10 mg by mouth daily.    . metoprolol succinate (TOPROL-XL) 25 MG 24 hr tablet TAKE 1 TABLET (25 MG TOTAL) BY MOUTH DAILY. 90 tablet 1  . Multiple Vitamins-Minerals (ICAPS PO) Take 1 tablet by mouth 2 (two) times daily.    . Omega 3 1200 MG CAPS Take 1,400 mg by mouth daily.     Marland Kitchen zolpidem (AMBIEN) 10 MG tablet Take 10 mg by mouth at bedtime as needed for sleep.      No current facility-administered medications for this visit.      Past Medical History:  Diagnosis Date  . Aortic stenosis   . Essential hypertension 05/30/2015   . Hyperlipidemia   . Hypertension   . Irregular heartbeat 06/02/2015  . Macular degeneration of both eyes    DRY, RIGHT EYE WORSE THAN LEFT  . Palpitations 05/30/2015  . PSVT (paroxysmal supraventricular tachycardia) ([place]) 07/23/2015  . PVC's (premature ventricular contractions) 07/23/2015    Past Surgical History:  Procedure Laterality Date  . CHOLECYSTECTOMY  10 YRS AGO  . COLONOSCOPY WITH PROPOFOL N/A 05/07/2014   Procedure: COLONOSCOPY WITH PROPOFOL;  Surgeon: Garlan Fair, MD;  Location: [facility];  Service: Endoscopy;  Laterality: N/A;  . EYE SURGERY Bilateral LAST FEW YEARS   LENS REPLACEMENT  FOR CATARACTS  . LAPAROSCOPY  YRS AGO  . TONSILLECTOMY  AS CHILD    Social History   Socioeconomic History  . Marital status: Married    Spouse name: Not on file  . Number of children: 1  . Years of education: Not on file  . Highest education level: Not on file  Occupational History  . Not on file  Social Needs  . Financial resource strain: Not on file  . Food insecurity:    Worry: Not on file    Inability: Not on file  . Transportation needs:    Medical: Not on file    Non-medical: Not on file  Tobacco Use  . Smoking status: Former Smoker    Packs/day: 1.00    Types: Cigarettes  Last attempt to quit: 11/08/1992    Years since quitting: 26.2  . Smokeless tobacco: Never Used  Substance and Sexual Activity  . Alcohol use: Yes    Alcohol/week: 0.0 standard drinks    Comment: RARE  . Drug use: No  . Sexual activity: Not on file  Lifestyle  . Physical activity:    Days per week: Not on file    Minutes per session: Not on file  . Stress: Not on file  Relationships  . Social connections:    Talks on phone: Not on file    Gets together: Not on file    Attends religious service: Not on file    Active member of club or organization: Not on file    Attends meetings of clubs or organizations: Not on file    Relationship status: Not on file  . Intimate partner  violence:    Fear of current or ex partner: Not on file    Emotionally abused: Not on file    Physically abused: Not on file    Forced sexual activity: Not on file  Other Topics Concern  . Not on file  Social History Narrative  . Not on file    Family History  Problem Relation Age of Onset  . Cancer Mother   . CAD Mother        CABG 69s  . COPD Father     ROS: no fevers or chills, productive cough, hemoptysis, dysphasia, odynophagia, melena, hematochezia, dysuria, hematuria, rash, seizure activity, orthopnea, PND, pedal edema, claudication. Remaining systems are negative.  Physical Exam: Well-developed well-nourished in no acute distress.  Skin is warm and dry.  HEENT is normal.  Neck is supple.  Chest is clear to auscultation with normal expansion.  Cardiovascular exam is regular rate and rhythm.  2/6 systolic murmur left sternal border.  S2 is not diminished. Abdominal exam nontender or distended. No masses palpated. Extremities show no edema. neuro grossly intact  A/P  1 AS-appears to be mild to moderate on most recent echocardiogram.  She will need follow-up study in August 2020.  We discussed the symptoms to be aware of including dyspnea, chest pain and syncope.  2 hypertension-patient's blood pressure is controlled.  Continue present medications and follow.  3 hyperlipidemia-continue statin.  Patient's lipids and liver are monitored by primary care.  4 palpitations-continue present dose of Toprol.  Symptoms are reasonably well controlled.  Kirk Ruths, MD

## 2019-01-22 DIAGNOSIS — H35031 Hypertensive retinopathy, right eye: Secondary | ICD-10-CM | POA: Diagnosis not present

## 2019-01-22 DIAGNOSIS — H35423 Microcystoid degeneration of retina, bilateral: Secondary | ICD-10-CM | POA: Diagnosis not present

## 2019-01-22 DIAGNOSIS — H353122 Nonexudative age-related macular degeneration, left eye, intermediate dry stage: Secondary | ICD-10-CM | POA: Diagnosis not present

## 2019-01-22 DIAGNOSIS — H353211 Exudative age-related macular degeneration, right eye, with active choroidal neovascularization: Secondary | ICD-10-CM | POA: Diagnosis not present

## 2019-01-23 ENCOUNTER — Encounter: Payer: Self-pay | Admitting: Cardiology

## 2019-01-23 ENCOUNTER — Other Ambulatory Visit: Payer: Self-pay

## 2019-01-23 ENCOUNTER — Ambulatory Visit: Payer: PPO | Admitting: Cardiology

## 2019-01-23 VITALS — BP 136/60 | HR 76 | Ht 65.0 in | Wt 162.0 lb

## 2019-01-23 DIAGNOSIS — I10 Essential (primary) hypertension: Secondary | ICD-10-CM | POA: Diagnosis not present

## 2019-01-23 DIAGNOSIS — R002 Palpitations: Secondary | ICD-10-CM | POA: Diagnosis not present

## 2019-01-23 DIAGNOSIS — I350 Nonrheumatic aortic (valve) stenosis: Secondary | ICD-10-CM | POA: Diagnosis not present

## 2019-01-23 DIAGNOSIS — E7800 Pure hypercholesterolemia, unspecified: Secondary | ICD-10-CM | POA: Diagnosis not present

## 2019-01-23 NOTE — Patient Instructions (Signed)

## 2019-03-19 DIAGNOSIS — H353211 Exudative age-related macular degeneration, right eye, with active choroidal neovascularization: Secondary | ICD-10-CM | POA: Diagnosis not present

## 2019-04-14 ENCOUNTER — Other Ambulatory Visit: Payer: Self-pay | Admitting: Cardiology

## 2019-05-01 ENCOUNTER — Other Ambulatory Visit: Payer: Self-pay | Admitting: Obstetrics and Gynecology

## 2019-05-01 DIAGNOSIS — Z1231 Encounter for screening mammogram for malignant neoplasm of breast: Secondary | ICD-10-CM

## 2019-05-07 DIAGNOSIS — Z0000 Encounter for general adult medical examination without abnormal findings: Secondary | ICD-10-CM | POA: Diagnosis not present

## 2019-05-07 DIAGNOSIS — I350 Nonrheumatic aortic (valve) stenosis: Secondary | ICD-10-CM | POA: Diagnosis not present

## 2019-05-07 DIAGNOSIS — Z79899 Other long term (current) drug therapy: Secondary | ICD-10-CM | POA: Diagnosis not present

## 2019-05-07 DIAGNOSIS — I471 Supraventricular tachycardia: Secondary | ICD-10-CM | POA: Diagnosis not present

## 2019-05-07 DIAGNOSIS — I10 Essential (primary) hypertension: Secondary | ICD-10-CM | POA: Diagnosis not present

## 2019-05-07 DIAGNOSIS — Z1389 Encounter for screening for other disorder: Secondary | ICD-10-CM | POA: Diagnosis not present

## 2019-05-07 DIAGNOSIS — E7800 Pure hypercholesterolemia, unspecified: Secondary | ICD-10-CM | POA: Diagnosis not present

## 2019-05-08 DIAGNOSIS — I10 Essential (primary) hypertension: Secondary | ICD-10-CM | POA: Diagnosis not present

## 2019-05-08 DIAGNOSIS — E7800 Pure hypercholesterolemia, unspecified: Secondary | ICD-10-CM | POA: Diagnosis not present

## 2019-05-08 DIAGNOSIS — Z79899 Other long term (current) drug therapy: Secondary | ICD-10-CM | POA: Diagnosis not present

## 2019-05-14 DIAGNOSIS — H353122 Nonexudative age-related macular degeneration, left eye, intermediate dry stage: Secondary | ICD-10-CM | POA: Diagnosis not present

## 2019-05-14 DIAGNOSIS — H353211 Exudative age-related macular degeneration, right eye, with active choroidal neovascularization: Secondary | ICD-10-CM | POA: Diagnosis not present

## 2019-05-14 DIAGNOSIS — H35423 Microcystoid degeneration of retina, bilateral: Secondary | ICD-10-CM | POA: Diagnosis not present

## 2019-05-14 DIAGNOSIS — H43813 Vitreous degeneration, bilateral: Secondary | ICD-10-CM | POA: Diagnosis not present

## 2019-07-09 DIAGNOSIS — H353211 Exudative age-related macular degeneration, right eye, with active choroidal neovascularization: Secondary | ICD-10-CM | POA: Diagnosis not present

## 2019-07-09 DIAGNOSIS — H353122 Nonexudative age-related macular degeneration, left eye, intermediate dry stage: Secondary | ICD-10-CM | POA: Diagnosis not present

## 2019-07-23 ENCOUNTER — Ambulatory Visit (INDEPENDENT_AMBULATORY_CARE_PROVIDER_SITE_OTHER): Payer: PPO | Admitting: Cardiology

## 2019-07-23 ENCOUNTER — Other Ambulatory Visit: Payer: Self-pay

## 2019-07-23 ENCOUNTER — Encounter: Payer: Self-pay | Admitting: Cardiology

## 2019-07-23 VITALS — BP 134/62 | HR 70 | Temp 97.5°F | Ht 66.0 in | Wt 160.0 lb

## 2019-07-23 DIAGNOSIS — I350 Nonrheumatic aortic (valve) stenosis: Secondary | ICD-10-CM | POA: Diagnosis not present

## 2019-07-23 DIAGNOSIS — E7800 Pure hypercholesterolemia, unspecified: Secondary | ICD-10-CM | POA: Diagnosis not present

## 2019-07-23 DIAGNOSIS — I10 Essential (primary) hypertension: Secondary | ICD-10-CM | POA: Diagnosis not present

## 2019-07-23 NOTE — Progress Notes (Signed)
HPI: FU AS and palpitations; cardiac catheterization May 2012 showed normal LV function and no obstructive coronary disease; mild AS with AVA 1.2 cm2. Holter monitor July 2016 showed sinus rhythm with first-degree AV block, PACs, PVCs and brief PAT.Last echocardiogram August 2019 showed normal LV function, moderate diastolic dysfunction, moderate aortic stenosis (mean gradient 17 mmHg), mild aortic insufficiency, mild mitral regurgitation and mildly elevated pulmonary pressure. Since last seenthe patient denies any dyspnea on exertion, orthopnea, PND, pedal edema, palpitations, syncope or chest pain.   Current Outpatient Medications  Medication Sig Dispense Refill  . acetaminophen (TYLENOL) 650 MG CR tablet Take 650 mg by mouth every 8 (eight) hours as needed for pain.    Marland Kitchen amLODipine (NORVASC) 10 MG tablet TAKE 1 TABLET (10 MG TOTAL) BY MOUTH DAILY. 90 tablet 3  . atorvastatin (LIPITOR) 20 MG tablet Take 20 mg by mouth daily.    . calcium-vitamin D (OSCAL WITH D) 500-200 MG-UNIT per tablet Take 1 tablet by mouth 2 (two) times daily.    . cholecalciferol (VITAMIN D) 1000 UNITS tablet Take 1,000 Units by mouth daily.    Marland Kitchen docusate sodium (COLACE) 100 MG capsule Take 100 mg by mouth daily.    . Glucosamine-Chondroit-Vit C-Mn (GLUCOSAMINE 1500 COMPLEX PO) Take 1,500 mg by mouth 2 (two) times daily.    . hydrochlorothiazide (HYDRODIURIL) 25 MG tablet Take 25 mg by mouth daily.    Marland Kitchen lisinopril (PRINIVIL,ZESTRIL) 10 MG tablet Take 10 mg by mouth daily.    . metoprolol succinate (TOPROL-XL) 25 MG 24 hr tablet TAKE 1 TABLET BY MOUTH EVERY DAY 90 tablet 1  . Multiple Vitamins-Minerals (ICAPS PO) Take 1 tablet by mouth 2 (two) times daily.    Marland Kitchen zolpidem (AMBIEN) 10 MG tablet Take 10 mg by mouth at bedtime as needed for sleep.      No current facility-administered medications for this visit.      Past Medical History:  Diagnosis Date  . Aortic stenosis   . Essential hypertension 05/30/2015   . Hyperlipidemia   . Hypertension   . Irregular heartbeat 06/02/2015  . Macular degeneration of both eyes    DRY, RIGHT EYE WORSE THAN LEFT  . Palpitations 05/30/2015  . PSVT (paroxysmal supraventricular tachycardia) ([place]) 07/23/2015  . PVC's (premature ventricular contractions) 07/23/2015    Past Surgical History:  Procedure Laterality Date  . CHOLECYSTECTOMY  10 YRS AGO  . COLONOSCOPY WITH PROPOFOL N/A 05/07/2014   Procedure: COLONOSCOPY WITH PROPOFOL;  Surgeon: Garlan Fair, MD;  Location: [facility];  Service: Endoscopy;  Laterality: N/A;  . EYE SURGERY Bilateral LAST FEW YEARS   LENS REPLACEMENT  FOR CATARACTS  . LAPAROSCOPY  YRS AGO  . TONSILLECTOMY  AS CHILD    Social History   Socioeconomic History  . Marital status: Married    Spouse name: Not on file  . Number of children: 1  . Years of education: Not on file  . Highest education level: Not on file  Occupational History  . Not on file  Social Needs  . Financial resource strain: Not on file  . Food insecurity    Worry: Not on file    Inability: Not on file  . Transportation needs    Medical: Not on file    Non-medical: Not on file  Tobacco Use  . Smoking status: Former Smoker    Packs/day: 1.00    Types: Cigarettes    Quit date: 11/08/1992    Years since quitting:  26.7  . Smokeless tobacco: Never Used  Substance and Sexual Activity  . Alcohol use: Yes    Alcohol/week: 0.0 standard drinks    Comment: RARE  . Drug use: No  . Sexual activity: Not on file  Lifestyle  . Physical activity    Days per week: Not on file    Minutes per session: Not on file  . Stress: Not on file  Relationships  . Social Herbalist on phone: Not on file    Gets together: Not on file    Attends religious service: Not on file    Active member of club or organization: Not on file    Attends meetings of clubs or organizations: Not on file    Relationship status: Not on file  . Intimate partner violence    Fear of  current or ex partner: Not on file    Emotionally abused: Not on file    Physically abused: Not on file    Forced sexual activity: Not on file  Other Topics Concern  . Not on file  Social History Narrative  . Not on file    Family History  Problem Relation Age of Onset  . Cancer Mother   . CAD Mother        CABG 46s  . COPD Father     ROS: no fevers or chills, productive cough, hemoptysis, dysphasia, odynophagia, melena, hematochezia, dysuria, hematuria, rash, seizure activity, orthopnea, PND, pedal edema, claudication. Remaining systems are negative.  Physical Exam: Well-developed well-nourished in no acute distress.  Skin is warm and dry.  HEENT is normal.  Neck is supple.  Chest is clear to auscultation with normal expansion.  Cardiovascular exam is regular rate and rhythm.  2/6 systolic murmur radiating to carotids.  S2 is not diminished. Abdominal exam nontender or distended. No masses palpated. Extremities show no edema. neuro grossly intact  ECG-normal sinus rhythm at a rate of 70, no ST changes.  Personally reviewed  A/P  1 aortic stenosis-we will plan to repeat echocardiogram to further assess.  Patient understands she will likely require aortic valve replacement in the future.  We discussed the symptoms to be aware of including chest pain, dyspnea and syncope.  2 hypertension-patient's blood pressure is controlled this morning.  Continue present medications and follow.  3 hyperlipidemia-continue statin.  4 history of palpitations-symptoms are well controlled.  Continue present dose of beta-blocker.  Kirk Ruths, MD

## 2019-07-23 NOTE — Patient Instructions (Signed)
Medication Instructions:  The current medical regimen is effective;  continue present plan and medications.  If you need a refill on your cardiac medications before your next appointment, please call your pharmacy.   Testing/Procedures: Echocardiogram - Your physician has requested that you have an echocardiogram. Echocardiography is a painless test that uses sound waves to create images of your heart. It provides your doctor with information about the size and shape of your heart and how well your heart's chambers and valves are working. This procedure takes approximately one hour. There are no restrictions for this procedure. This will be performed at our [facility] location - [address], [address].   Follow-Up: At [facility], you and your health needs are our priority.  As part of our continuing mission to provide you with exceptional heart care, we have created designated Provider Care Teams.  These Care Teams include your primary Cardiologist (physician) and Advanced Practice Providers (APPs -  Physician Assistants and Nurse Practitioners) who all work together to provide you with the care you need, when you need it. You will need a follow up appointment in 12 months.  Please call our office 2 months in advance to schedule this appointment.  You may see Kirk Ruths, MD or one of the following Advanced Practice Providers on your designated Care Team:   Kerin Ransom, PA-C [place]y [place], [place] . Sande Rives, PA-C

## 2019-07-31 ENCOUNTER — Other Ambulatory Visit: Payer: Self-pay

## 2019-07-31 ENCOUNTER — Ambulatory Visit (HOSPITAL_COMMUNITY): Payer: PPO | Attending: Cardiology

## 2019-07-31 DIAGNOSIS — I350 Nonrheumatic aortic (valve) stenosis: Secondary | ICD-10-CM | POA: Diagnosis not present

## 2019-08-07 ENCOUNTER — Other Ambulatory Visit: Payer: Self-pay

## 2019-08-07 ENCOUNTER — Ambulatory Visit
Admission: RE | Admit: 2019-08-07 | Discharge: 2019-08-07 | Disposition: A | Discharge status: Home / Self Care | Payer: PPO | Source: Ambulatory Visit | Attending: Obstetrics and Gynecology | Admitting: Obstetrics and Gynecology

## 2019-08-07 DIAGNOSIS — Z1231 Encounter for screening mammogram for malignant neoplasm of breast: Secondary | ICD-10-CM | POA: Diagnosis not present

## 2019-08-20 DIAGNOSIS — Z6826 Body mass index (BMI) 26.0-26.9, adult: Secondary | ICD-10-CM | POA: Diagnosis not present

## 2019-08-20 DIAGNOSIS — Z01419 Encounter for gynecological examination (general) (routine) without abnormal findings: Secondary | ICD-10-CM | POA: Diagnosis not present

## 2019-09-06 DIAGNOSIS — H35423 Microcystoid degeneration of retina, bilateral: Secondary | ICD-10-CM | POA: Diagnosis not present

## 2019-09-06 DIAGNOSIS — H43813 Vitreous degeneration, bilateral: Secondary | ICD-10-CM | POA: Diagnosis not present

## 2019-09-06 DIAGNOSIS — H353122 Nonexudative age-related macular degeneration, left eye, intermediate dry stage: Secondary | ICD-10-CM | POA: Diagnosis not present

## 2019-09-06 DIAGNOSIS — H353211 Exudative age-related macular degeneration, right eye, with active choroidal neovascularization: Secondary | ICD-10-CM | POA: Diagnosis not present

## 2019-09-07 DIAGNOSIS — C44719 Basal cell carcinoma of skin of left lower limb, including hip: Secondary | ICD-10-CM | POA: Diagnosis not present

## 2019-09-07 DIAGNOSIS — D2262 Melanocytic nevi of left upper limb, including shoulder: Secondary | ICD-10-CM | POA: Diagnosis not present

## 2019-09-07 DIAGNOSIS — L814 Other melanin hyperpigmentation: Secondary | ICD-10-CM | POA: Diagnosis not present

## 2019-09-07 DIAGNOSIS — L565 Disseminated superficial actinic porokeratosis (DSAP): Secondary | ICD-10-CM | POA: Diagnosis not present

## 2019-09-07 DIAGNOSIS — D225 Melanocytic nevi of trunk: Secondary | ICD-10-CM | POA: Diagnosis not present

## 2019-09-07 DIAGNOSIS — D1801 Hemangioma of skin and subcutaneous tissue: Secondary | ICD-10-CM | POA: Diagnosis not present

## 2019-09-07 DIAGNOSIS — L821 Other seborrheic keratosis: Secondary | ICD-10-CM | POA: Diagnosis not present

## 2019-10-14 ENCOUNTER — Other Ambulatory Visit: Payer: Self-pay | Admitting: Cardiology

## 2019-10-23 ENCOUNTER — Other Ambulatory Visit: Payer: Self-pay | Admitting: Cardiology

## 2019-11-05 DIAGNOSIS — H353211 Exudative age-related macular degeneration, right eye, with active choroidal neovascularization: Secondary | ICD-10-CM | POA: Diagnosis not present

## 2019-11-05 DIAGNOSIS — H353122 Nonexudative age-related macular degeneration, left eye, intermediate dry stage: Secondary | ICD-10-CM | POA: Diagnosis not present

## 2019-12-17 ENCOUNTER — Ambulatory Visit: Payer: PPO

## 2020-01-03 ENCOUNTER — Ambulatory Visit: Payer: PPO

## 2020-01-14 DIAGNOSIS — H43813 Vitreous degeneration, bilateral: Secondary | ICD-10-CM | POA: Diagnosis not present

## 2020-01-14 DIAGNOSIS — H35423 Microcystoid degeneration of retina, bilateral: Secondary | ICD-10-CM | POA: Diagnosis not present

## 2020-01-14 DIAGNOSIS — H353122 Nonexudative age-related macular degeneration, left eye, intermediate dry stage: Secondary | ICD-10-CM | POA: Diagnosis not present

## 2020-01-14 DIAGNOSIS — H353211 Exudative age-related macular degeneration, right eye, with active choroidal neovascularization: Secondary | ICD-10-CM | POA: Diagnosis not present

## 2020-02-01 NOTE — Progress Notes (Signed)
HPI: FU AS and palpitations; cardiac catheterization May 2012 showed normal LV function and no obstructive coronary disease; mild AS with AVA 1.2 cm2. Holter monitor July 2016 showed sinus rhythm with first-degree AV block, PACs, PVCs and brief PAT.Last echocardiogram September 2020 showed normal LV function, mild left ventricular hypertrophy, mild mitral regurgitation, moderate aortic insufficiency, moderate aortic stenosis with mean gradient 15 mmHg.  Since last seenshe denies dyspnea, chest pain, palpitations or syncope.  Current Outpatient Medications  Medication Sig Dispense Refill  . acetaminophen (TYLENOL) 650 MG CR tablet Take 650 mg by mouth every 8 (eight) hours as needed for pain.    Marland Kitchen amLODipine (NORVASC) 10 MG tablet TAKE 1 TABLET BY MOUTH EVERY DAY 90 tablet 3  . atorvastatin (LIPITOR) 20 MG tablet Take 20 mg by mouth daily.    . calcium-vitamin D (OSCAL WITH D) 500-200 MG-UNIT per tablet Take 1 tablet by mouth 2 (two) times daily.    . cholecalciferol (VITAMIN D) 1000 UNITS tablet Take 1,000 Units by mouth daily.    Marland Kitchen docusate sodium (COLACE) 100 MG capsule Take 100 mg by mouth daily.    . Glucosamine-Chondroit-Vit C-Mn (GLUCOSAMINE 1500 COMPLEX PO) Take 1,500 mg by mouth 2 (two) times daily.    . hydrochlorothiazide (HYDRODIURIL) 25 MG tablet Take 25 mg by mouth daily.    Marland Kitchen lisinopril (PRINIVIL,ZESTRIL) 10 MG tablet Take 10 mg by mouth daily.    . metoprolol succinate (TOPROL-XL) 25 MG 24 hr tablet TAKE 1 TABLET BY MOUTH EVERY DAY 90 tablet 3  . Multiple Vitamins-Minerals (ICAPS PO) Take 1 tablet by mouth 2 (two) times daily.    Marland Kitchen zolpidem (AMBIEN) 10 MG tablet Take 10 mg by mouth at bedtime as needed for sleep.      No current facility-administered medications for this visit.     Past Medical History:  Diagnosis Date  . Aortic stenosis   . Essential hypertension 05/30/2015  . Hyperlipidemia   . Hypertension   . Irregular heartbeat 06/02/2015  . Macular  degeneration of both eyes    DRY, RIGHT EYE WORSE THAN LEFT  . Palpitations 05/30/2015  . PSVT (paroxysmal supraventricular tachycardia) ([place]) 07/23/2015  . PVC's (premature ventricular contractions) 07/23/2015    Past Surgical History:  Procedure Laterality Date  . CHOLECYSTECTOMY  10 YRS AGO  . COLONOSCOPY WITH PROPOFOL N/A 05/07/2014   Procedure: COLONOSCOPY WITH PROPOFOL;  Surgeon: Garlan Fair, MD;  Location: [facility];  Service: Endoscopy;  Laterality: N/A;  . EYE SURGERY Bilateral LAST FEW YEARS   LENS REPLACEMENT  FOR CATARACTS  . LAPAROSCOPY  YRS AGO  . TONSILLECTOMY  AS CHILD    Social History   Socioeconomic History  . Marital status: Married    Spouse name: Not on file  . Number of children: 1  . Years of education: Not on file  . Highest education level: Not on file  Occupational History  . Not on file  Tobacco Use  . Smoking status: Former Smoker    Packs/day: 1.00    Types: Cigarettes    Quit date: 11/08/1992    Years since quitting: 27.2  . Smokeless tobacco: Never Used  Substance and Sexual Activity  . Alcohol use: Yes    Alcohol/week: 0.0 standard drinks    Comment: RARE  . Drug use: No  . Sexual activity: Not on file  Other Topics Concern  . Not on file  Social History Narrative  . Not on file  Social Determinants of Health   Financial Resource Strain:   . Difficulty of Paying Living Expenses:   Food Insecurity:   . Worried About Charity fundraiser in the Last Year:   . Arboriculturist in the Last Year:   Transportation Needs:   . Film/video editor (Medical):   Marland Kitchen Lack of Transportation (Non-Medical):   Physical Activity:   . Days of Exercise per Week:   . Minutes of Exercise per Session:   Stress:   . Feeling of Stress :   Social Connections:   . Frequency of Communication with Friends and Family:   . Frequency of Social Gatherings with Friends and Family:   . Attends Religious Services:   . Active Member of Clubs or  Organizations:   . Attends Archivist Meetings:   Marland Kitchen Marital Status:   Intimate Partner Violence:   . Fear of Current or Ex-Partner:   . Emotionally Abused:   Marland Kitchen Physically Abused:   . Sexually Abused:     Family History  Problem Relation Age of Onset  . Cancer Mother   . CAD Mother        CABG 17s  . COPD Father     ROS: no fevers or chills, productive cough, hemoptysis, dysphasia, odynophagia, melena, hematochezia, dysuria, hematuria, rash, seizure activity, orthopnea, PND, pedal edema, claudication. Remaining systems are negative.  Physical Exam: Well-developed well-nourished in no acute distress.  Skin is warm and dry.  HEENT is normal.  Neck is supple.  Chest is clear to auscultation with normal expansion.  Cardiovascular exam is regular rate and rhythm.  2/6 systolic murmur left sternal border.  1/6 diastolic murmur. Abdominal exam nontender or distended. No masses palpated. Extremities show no edema. neuro grossly intact  A/P  1 aortic stenosis/aortic insufficiency-plan follow-up echocardiogram in September 2021.  At present she is not experiencing symptoms of chest pain, dyspnea or syncope.  I have explained that she will likely require aortic valve replacement in the future.  2 hypertension-patient's blood pressure is controlled.  Continue present medical regimen.  Potassium and renal function monitored by primary care.  3 hyperlipidemia-continue statin.  Lipids and liver monitored by primary care.  4 palpitations-symptoms are controlled.  Continue beta-blocker.  Kirk Ruths, MD

## 2020-02-08 ENCOUNTER — Other Ambulatory Visit: Payer: Self-pay

## 2020-02-08 ENCOUNTER — Encounter: Payer: Self-pay | Admitting: Cardiology

## 2020-02-08 ENCOUNTER — Ambulatory Visit: Payer: PPO | Admitting: Cardiology

## 2020-02-08 VITALS — BP 142/58 | HR 72 | Ht 66.0 in | Wt 160.0 lb

## 2020-02-08 DIAGNOSIS — I10 Essential (primary) hypertension: Secondary | ICD-10-CM

## 2020-02-08 DIAGNOSIS — E7800 Pure hypercholesterolemia, unspecified: Secondary | ICD-10-CM

## 2020-02-08 DIAGNOSIS — I350 Nonrheumatic aortic (valve) stenosis: Secondary | ICD-10-CM

## 2020-02-08 DIAGNOSIS — R002 Palpitations: Secondary | ICD-10-CM | POA: Diagnosis not present

## 2020-02-08 NOTE — Patient Instructions (Signed)
Medication Instructions:  NO CHANGE *If you need a refill on your cardiac medications before your next appointment, please call your pharmacy*   Lab Work: If you have labs (blood work) drawn today and your tests are completely normal, you will receive your results only by: Marland Kitchen MyChart Message (if you have MyChart) OR . A paper copy in the mail If you have any lab test that is abnormal or we need to change your treatment, we will call you to review the results.   Testing/Procedures: Your physician has requested that you have an echocardiogram. Echocardiography is a painless test that uses sound waves to create images of your heart. It provides your doctor with information about the size and shape of your heart and how well your heart's chambers and valves are working. This procedure takes approximately one hour. There are no restrictions for this procedure.[name]     Follow-Up: At [facility], you and your health needs are our priority.  As part of our continuing mission to provide you with exceptional heart care, we have created designated Provider Care Teams.  These Care Teams include your primary Cardiologist (physician) and Advanced Practice Providers (APPs -  Physician Assistants and Nurse Practitioners) who all work together to provide you with the care you need, when you need it.  We recommend signing up for the patient portal called "MyChart".  Sign up information is provided on this After Visit Summary.  MyChart is used to connect with patients for Virtual Visits (Telemedicine).  Patients are able to view lab/test results, encounter notes, upcoming appointments, etc.  Non-urgent messages can be sent to your provider as well.   To learn more about what you can do with MyChart, go to [email or web address].    Your next appointment:   6 month(s)  The format for your next appointment:   Either In Person or Virtual  Provider:   You may see  Kirk Ruths, MD or one of the following Advanced Practice Providers on your designated Care Team:    Kerin Ransom, PA-C  [place], [place]  Coletta Memos, [place]

## 2020-03-04 DIAGNOSIS — Z961 Presence of intraocular lens: Secondary | ICD-10-CM | POA: Diagnosis not present

## 2020-03-04 DIAGNOSIS — H52203 Unspecified astigmatism, bilateral: Secondary | ICD-10-CM | POA: Diagnosis not present

## 2020-03-04 DIAGNOSIS — H5213 Myopia, bilateral: Secondary | ICD-10-CM | POA: Diagnosis not present

## 2020-03-12 DIAGNOSIS — H35423 Microcystoid degeneration of retina, bilateral: Secondary | ICD-10-CM | POA: Diagnosis not present

## 2020-03-12 DIAGNOSIS — H43813 Vitreous degeneration, bilateral: Secondary | ICD-10-CM | POA: Diagnosis not present

## 2020-03-12 DIAGNOSIS — H353211 Exudative age-related macular degeneration, right eye, with active choroidal neovascularization: Secondary | ICD-10-CM | POA: Diagnosis not present

## 2020-03-12 DIAGNOSIS — H353122 Nonexudative age-related macular degeneration, left eye, intermediate dry stage: Secondary | ICD-10-CM | POA: Diagnosis not present

## 2020-03-27 DIAGNOSIS — E7800 Pure hypercholesterolemia, unspecified: Secondary | ICD-10-CM | POA: Diagnosis not present

## 2020-03-27 DIAGNOSIS — I10 Essential (primary) hypertension: Secondary | ICD-10-CM | POA: Diagnosis not present

## 2020-05-05 DIAGNOSIS — H43813 Vitreous degeneration, bilateral: Secondary | ICD-10-CM | POA: Diagnosis not present

## 2020-05-05 DIAGNOSIS — H353122 Nonexudative age-related macular degeneration, left eye, intermediate dry stage: Secondary | ICD-10-CM | POA: Diagnosis not present

## 2020-05-05 DIAGNOSIS — H353211 Exudative age-related macular degeneration, right eye, with active choroidal neovascularization: Secondary | ICD-10-CM | POA: Diagnosis not present

## 2020-05-05 DIAGNOSIS — H35423 Microcystoid degeneration of retina, bilateral: Secondary | ICD-10-CM | POA: Diagnosis not present

## 2020-06-25 DIAGNOSIS — Z79899 Other long term (current) drug therapy: Secondary | ICD-10-CM | POA: Diagnosis not present

## 2020-06-25 DIAGNOSIS — I10 Essential (primary) hypertension: Secondary | ICD-10-CM | POA: Diagnosis not present

## 2020-06-25 DIAGNOSIS — Z0000 Encounter for general adult medical examination without abnormal findings: Secondary | ICD-10-CM | POA: Diagnosis not present

## 2020-06-25 DIAGNOSIS — I471 Supraventricular tachycardia: Secondary | ICD-10-CM | POA: Diagnosis not present

## 2020-06-25 DIAGNOSIS — Z1389 Encounter for screening for other disorder: Secondary | ICD-10-CM | POA: Diagnosis not present

## 2020-06-25 DIAGNOSIS — E7800 Pure hypercholesterolemia, unspecified: Secondary | ICD-10-CM | POA: Diagnosis not present

## 2020-06-25 DIAGNOSIS — I350 Nonrheumatic aortic (valve) stenosis: Secondary | ICD-10-CM | POA: Diagnosis not present

## 2020-06-30 DIAGNOSIS — H35423 Microcystoid degeneration of retina, bilateral: Secondary | ICD-10-CM | POA: Diagnosis not present

## 2020-06-30 DIAGNOSIS — H353122 Nonexudative age-related macular degeneration, left eye, intermediate dry stage: Secondary | ICD-10-CM | POA: Diagnosis not present

## 2020-06-30 DIAGNOSIS — H43813 Vitreous degeneration, bilateral: Secondary | ICD-10-CM | POA: Diagnosis not present

## 2020-06-30 DIAGNOSIS — H353211 Exudative age-related macular degeneration, right eye, with active choroidal neovascularization: Secondary | ICD-10-CM | POA: Diagnosis not present

## 2020-07-03 ENCOUNTER — Other Ambulatory Visit: Payer: Self-pay | Admitting: Obstetrics and Gynecology

## 2020-07-03 DIAGNOSIS — Z1231 Encounter for screening mammogram for malignant neoplasm of breast: Secondary | ICD-10-CM

## 2020-07-11 ENCOUNTER — Ambulatory Visit (HOSPITAL_COMMUNITY): Payer: PPO | Attending: Cardiology

## 2020-07-11 ENCOUNTER — Other Ambulatory Visit: Payer: Self-pay

## 2020-07-11 DIAGNOSIS — I350 Nonrheumatic aortic (valve) stenosis: Secondary | ICD-10-CM | POA: Diagnosis not present

## 2020-07-11 LAB — ECHOCARDIOGRAM COMPLETE
AR max vel: 1.03 cm2
AV Area VTI: 1.04 cm2
AV Area mean vel: 1.03 cm2
AV Mean grad: 17.5 mmHg
AV Peak grad: 31.5 mmHg
Ao pk vel: 2.81 m/s
Area-P 1/2: 3.19 cm2
P 1/2 time: 329 msec
S' Lateral: 2.5 cm

## 2020-08-07 ENCOUNTER — Ambulatory Visit
Admission: RE | Admit: 2020-08-07 | Discharge: 2020-08-07 | Disposition: A | Discharge status: Home / Self Care | Payer: PPO | Source: Ambulatory Visit | Attending: Obstetrics and Gynecology | Admitting: Obstetrics and Gynecology

## 2020-08-07 ENCOUNTER — Other Ambulatory Visit: Payer: Self-pay

## 2020-08-07 DIAGNOSIS — Z1231 Encounter for screening mammogram for malignant neoplasm of breast: Secondary | ICD-10-CM | POA: Diagnosis not present

## 2020-08-19 NOTE — Progress Notes (Signed)
HPI: FU AS and palpitations; cardiac catheterization May 2012 showed normal LV function and no obstructive coronary disease; mild AS with AVA 1.2 cm2. Holter monitor July 2016 showed sinus rhythm with first-degree AV block, PACs, PVCs and brief PAT.Last echocardiogram September 2021 showed normal LV function, grade 2 diastolic dysfunction, mild mitral regurgitation, mild aortic insufficiency, mild to moderate aortic stenosis with mean gradient 17 mmHg. Since last seenshe denies dyspnea on exertion, orthopnea, PND, pedal edema, chest pain or syncope.  Current Outpatient Medications  Medication Sig Dispense Refill  . acetaminophen (TYLENOL) 650 MG CR tablet Take 650 mg by mouth every 8 (eight) hours as needed for pain.    Marland Kitchen amLODipine (NORVASC) 10 MG tablet TAKE 1 TABLET BY MOUTH EVERY DAY 90 tablet 3  . atorvastatin (LIPITOR) 20 MG tablet Take 20 mg by mouth daily.    . calcium-vitamin D (OSCAL WITH D) 500-200 MG-UNIT per tablet Take 1 tablet by mouth 2 (two) times daily.    . cholecalciferol (VITAMIN D) 1000 UNITS tablet Take 1,000 Units by mouth daily.    . hydrochlorothiazide (HYDRODIURIL) 25 MG tablet Take 25 mg by mouth daily.    Marland Kitchen lisinopril (PRINIVIL,ZESTRIL) 10 MG tablet Take 10 mg by mouth daily.    . metoprolol succinate (TOPROL-XL) 25 MG 24 hr tablet TAKE 1 TABLET BY MOUTH EVERY DAY 90 tablet 3  . Multiple Vitamins-Minerals (ICAPS PO) Take 1 tablet by mouth 2 (two) times daily.    Marland Kitchen zolpidem (AMBIEN) 10 MG tablet Take 10 mg by mouth at bedtime as needed for sleep.      No current facility-administered medications for this visit.     Past Medical History:  Diagnosis Date  . Aortic stenosis   . Essential hypertension 05/30/2015  . Hyperlipidemia   . Hypertension   . Irregular heartbeat 06/02/2015  . Macular degeneration of both eyes    DRY, RIGHT EYE WORSE THAN LEFT  . Palpitations 05/30/2015  . PSVT (paroxysmal supraventricular tachycardia) ([place]) 07/23/2015  . PVC's  (premature ventricular contractions) 07/23/2015    Past Surgical History:  Procedure Laterality Date  . CHOLECYSTECTOMY  10 YRS AGO  . COLONOSCOPY WITH PROPOFOL N/A 05/07/2014   Procedure: COLONOSCOPY WITH PROPOFOL;  Surgeon: Garlan Fair, MD;  Location: [facility];  Service: Endoscopy;  Laterality: N/A;  . EYE SURGERY Bilateral LAST FEW YEARS   LENS REPLACEMENT  FOR CATARACTS  . LAPAROSCOPY  YRS AGO  . TONSILLECTOMY  AS CHILD    Social History   Socioeconomic History  . Marital status: Married    Spouse name: Not on file  . Number of children: 1  . Years of education: Not on file  . Highest education level: Not on file  Occupational History  . Not on file  Tobacco Use  . Smoking status: Former Smoker    Packs/day: 1.00    Types: Cigarettes    Quit date: 11/08/1992    Years since quitting: 27.8  . Smokeless tobacco: Never Used  Vaping Use  . Vaping Use: Never used  Substance and Sexual Activity  . Alcohol use: Yes    Alcohol/week: 0.0 standard drinks    Comment: RARE  . Drug use: No  . Sexual activity: Not on file  Other Topics Concern  . Not on file  Social History Narrative  . Not on file   Social Determinants of Health   Financial Resource Strain:   . Difficulty of Paying Living Expenses: Not on file  Food Insecurity:   . Worried About Charity fundraiser in the Last Year: Not on file  . Ran Out of Food in the Last Year: Not on file  Transportation Needs:   . Lack of Transportation (Medical): Not on file  . Lack of Transportation (Non-Medical): Not on file  Physical Activity:   . Days of Exercise per Week: Not on file  . Minutes of Exercise per Session: Not on file  Stress:   . Feeling of Stress : Not on file  Social Connections:   . Frequency of Communication with Friends and Family: Not on file  . Frequency of Social Gatherings with Friends and Family: Not on file  . Attends Religious Services: Not on file  . Active Member of Clubs or  Organizations: Not on file  . Attends Archivist Meetings: Not on file  . Marital Status: Not on file  Intimate Partner Violence:   . Fear of Current or Ex-Partner: Not on file  . Emotionally Abused: Not on file  . Physically Abused: Not on file  . Sexually Abused: Not on file    Family History  Problem Relation Age of Onset  . Cancer Mother   . CAD Mother        CABG 87s  . COPD Father     ROS: no fevers or chills, productive cough, hemoptysis, dysphasia, odynophagia, melena, hematochezia, dysuria, hematuria, rash, seizure activity, orthopnea, PND, pedal edema, claudication. Remaining systems are negative.  Physical Exam: Well-developed well-nourished in no acute distress.  Skin is warm and dry.  HEENT is normal.  Neck is supple.  Chest is clear to auscultation with normal expansion.  Cardiovascular exam is regular rate and rhythm.  Abdominal exam nontender or distended. No masses palpated. Extremities show no edema. neuro grossly intact  ECG-normal sinus rhythm at a rate of 80, no ST changes. Personally reviewed  A/P  1 AS/AI-mild to moderate on most recent echocardiogram.  She continues to do well with no symptoms.  Plan follow-up echocardiogram September 2022 or sooner if she develops symptoms.  She understands she will likely require aortic valve replacement in the future.  We discussed the symptoms to be aware of including worsening dyspnea on exertion, chest pain or syncope.  2 hypertension-blood pressure mildly elevated.  She will track this and we will increase lisinopril if needed.  3 hyperlipidemia-continue statin.  4 palpitations-symptoms are well controlled today.  Continue beta-blocker at present dose.  Kirk Ruths, MD

## 2020-08-20 DIAGNOSIS — Z6825 Body mass index (BMI) 25.0-25.9, adult: Secondary | ICD-10-CM | POA: Diagnosis not present

## 2020-08-20 DIAGNOSIS — Z01419 Encounter for gynecological examination (general) (routine) without abnormal findings: Secondary | ICD-10-CM | POA: Diagnosis not present

## 2020-08-25 ENCOUNTER — Ambulatory Visit: Payer: PPO | Admitting: Cardiology

## 2020-08-25 ENCOUNTER — Encounter: Payer: Self-pay | Admitting: Cardiology

## 2020-08-25 ENCOUNTER — Other Ambulatory Visit: Payer: Self-pay

## 2020-08-25 VITALS — BP 148/60 | HR 80 | Ht 66.0 in | Wt 151.2 lb

## 2020-08-25 DIAGNOSIS — E7800 Pure hypercholesterolemia, unspecified: Secondary | ICD-10-CM

## 2020-08-25 DIAGNOSIS — I350 Nonrheumatic aortic (valve) stenosis: Secondary | ICD-10-CM

## 2020-08-25 DIAGNOSIS — R002 Palpitations: Secondary | ICD-10-CM | POA: Diagnosis not present

## 2020-08-25 DIAGNOSIS — I10 Essential (primary) hypertension: Secondary | ICD-10-CM | POA: Diagnosis not present

## 2020-08-25 NOTE — Patient Instructions (Signed)

## 2020-08-28 DIAGNOSIS — H43813 Vitreous degeneration, bilateral: Secondary | ICD-10-CM | POA: Diagnosis not present

## 2020-08-28 DIAGNOSIS — H35423 Microcystoid degeneration of retina, bilateral: Secondary | ICD-10-CM | POA: Diagnosis not present

## 2020-08-28 DIAGNOSIS — H353122 Nonexudative age-related macular degeneration, left eye, intermediate dry stage: Secondary | ICD-10-CM | POA: Diagnosis not present

## 2020-08-28 DIAGNOSIS — H353211 Exudative age-related macular degeneration, right eye, with active choroidal neovascularization: Secondary | ICD-10-CM | POA: Diagnosis not present

## 2020-09-15 DIAGNOSIS — L814 Other melanin hyperpigmentation: Secondary | ICD-10-CM | POA: Diagnosis not present

## 2020-09-15 DIAGNOSIS — L720 Epidermal cyst: Secondary | ICD-10-CM | POA: Diagnosis not present

## 2020-09-15 DIAGNOSIS — L570 Actinic keratosis: Secondary | ICD-10-CM | POA: Diagnosis not present

## 2020-09-15 DIAGNOSIS — Z85828 Personal history of other malignant neoplasm of skin: Secondary | ICD-10-CM | POA: Diagnosis not present

## 2020-09-15 DIAGNOSIS — L821 Other seborrheic keratosis: Secondary | ICD-10-CM | POA: Diagnosis not present

## 2020-09-17 ENCOUNTER — Other Ambulatory Visit: Payer: Self-pay | Admitting: Cardiology

## 2020-09-22 DIAGNOSIS — I10 Essential (primary) hypertension: Secondary | ICD-10-CM | POA: Diagnosis not present

## 2020-09-22 DIAGNOSIS — E7800 Pure hypercholesterolemia, unspecified: Secondary | ICD-10-CM | POA: Diagnosis not present

## 2020-09-22 DIAGNOSIS — G8929 Other chronic pain: Secondary | ICD-10-CM | POA: Diagnosis not present

## 2020-10-20 DIAGNOSIS — H353211 Exudative age-related macular degeneration, right eye, with active choroidal neovascularization: Secondary | ICD-10-CM | POA: Diagnosis not present

## 2020-11-14 DIAGNOSIS — I10 Essential (primary) hypertension: Secondary | ICD-10-CM | POA: Diagnosis not present

## 2020-11-14 DIAGNOSIS — G8929 Other chronic pain: Secondary | ICD-10-CM | POA: Diagnosis not present

## 2020-11-14 DIAGNOSIS — E7800 Pure hypercholesterolemia, unspecified: Secondary | ICD-10-CM | POA: Diagnosis not present

## 2020-12-01 DIAGNOSIS — H353122 Nonexudative age-related macular degeneration, left eye, intermediate dry stage: Secondary | ICD-10-CM | POA: Diagnosis not present

## 2020-12-01 DIAGNOSIS — H35423 Microcystoid degeneration of retina, bilateral: Secondary | ICD-10-CM | POA: Diagnosis not present

## 2020-12-01 DIAGNOSIS — H353211 Exudative age-related macular degeneration, right eye, with active choroidal neovascularization: Secondary | ICD-10-CM | POA: Diagnosis not present

## 2020-12-01 DIAGNOSIS — H43813 Vitreous degeneration, bilateral: Secondary | ICD-10-CM | POA: Diagnosis not present

## 2020-12-23 ENCOUNTER — Other Ambulatory Visit: Payer: Self-pay | Admitting: Cardiology

## 2020-12-26 DIAGNOSIS — I10 Essential (primary) hypertension: Secondary | ICD-10-CM | POA: Diagnosis not present

## 2020-12-26 DIAGNOSIS — I471 Supraventricular tachycardia: Secondary | ICD-10-CM | POA: Diagnosis not present

## 2020-12-26 DIAGNOSIS — F4321 Adjustment disorder with depressed mood: Secondary | ICD-10-CM | POA: Diagnosis not present

## 2021-01-08 ENCOUNTER — Telehealth: Payer: Self-pay | Admitting: Cardiology

## 2021-01-08 DIAGNOSIS — G8929 Other chronic pain: Secondary | ICD-10-CM | POA: Diagnosis not present

## 2021-01-08 DIAGNOSIS — F4321 Adjustment disorder with depressed mood: Secondary | ICD-10-CM | POA: Diagnosis not present

## 2021-01-08 DIAGNOSIS — I10 Essential (primary) hypertension: Secondary | ICD-10-CM | POA: Diagnosis not present

## 2021-01-08 DIAGNOSIS — E7800 Pure hypercholesterolemia, unspecified: Secondary | ICD-10-CM | POA: Diagnosis not present

## 2021-01-08 NOTE — Telephone Encounter (Signed)
*  STAT* If patient is at the pharmacy, call can be transferred to refill team.   1. Which medications need to be refilled? (please list name of each medication and dose if known)  amLODipine (NORVASC) 10 MG tablet [[id number] metoprolol succinate (TOPROL-XL) 25 MG 24 hr tablet     2. Which pharmacy/location (including street and city if local pharmacy) is medication to be sent to? [facility] - [place], [place] - [address] Phone:  [phone]  Fax:  [phone]      3. Do they need a 30 day or 90 day supply? 90 pt went to a different pharmacy and they are needing all new scripts

## 2021-01-09 MED ORDER — AMLODIPINE BESYLATE 10 MG PO TABS: 10.0000 mg | ORAL_TABLET | Freq: Every day | ORAL | 0 refills | Status: DC

## 2021-01-09 MED ORDER — METOPROLOL SUCCINATE ER 25 MG PO TB24: 25.0000 mg | ORAL_TABLET | Freq: Every day | ORAL | 0 refills | Status: DC

## 2021-01-21 DIAGNOSIS — H353211 Exudative age-related macular degeneration, right eye, with active choroidal neovascularization: Secondary | ICD-10-CM | POA: Diagnosis not present

## 2021-01-21 DIAGNOSIS — H353122 Nonexudative age-related macular degeneration, left eye, intermediate dry stage: Secondary | ICD-10-CM | POA: Diagnosis not present

## 2021-01-23 DIAGNOSIS — I350 Nonrheumatic aortic (valve) stenosis: Secondary | ICD-10-CM | POA: Diagnosis not present

## 2021-01-23 DIAGNOSIS — F411 Generalized anxiety disorder: Secondary | ICD-10-CM | POA: Diagnosis not present

## 2021-01-23 DIAGNOSIS — I10 Essential (primary) hypertension: Secondary | ICD-10-CM | POA: Diagnosis not present

## 2021-01-23 DIAGNOSIS — F430 Acute stress reaction: Secondary | ICD-10-CM | POA: Diagnosis not present

## 2021-02-23 DIAGNOSIS — I10 Essential (primary) hypertension: Secondary | ICD-10-CM | POA: Diagnosis not present

## 2021-02-23 DIAGNOSIS — F430 Acute stress reaction: Secondary | ICD-10-CM | POA: Diagnosis not present

## 2021-03-10 DIAGNOSIS — Z961 Presence of intraocular lens: Secondary | ICD-10-CM | POA: Diagnosis not present

## 2021-03-10 DIAGNOSIS — H524 Presbyopia: Secondary | ICD-10-CM | POA: Diagnosis not present

## 2021-03-12 DIAGNOSIS — H43813 Vitreous degeneration, bilateral: Secondary | ICD-10-CM | POA: Diagnosis not present

## 2021-03-12 DIAGNOSIS — H353122 Nonexudative age-related macular degeneration, left eye, intermediate dry stage: Secondary | ICD-10-CM | POA: Diagnosis not present

## 2021-03-12 DIAGNOSIS — H35423 Microcystoid degeneration of retina, bilateral: Secondary | ICD-10-CM | POA: Diagnosis not present

## 2021-03-12 DIAGNOSIS — H353211 Exudative age-related macular degeneration, right eye, with active choroidal neovascularization: Secondary | ICD-10-CM | POA: Diagnosis not present

## 2021-03-23 DIAGNOSIS — F4321 Adjustment disorder with depressed mood: Secondary | ICD-10-CM | POA: Diagnosis not present

## 2021-03-23 DIAGNOSIS — I10 Essential (primary) hypertension: Secondary | ICD-10-CM | POA: Diagnosis not present

## 2021-03-23 DIAGNOSIS — E7800 Pure hypercholesterolemia, unspecified: Secondary | ICD-10-CM | POA: Diagnosis not present

## 2021-03-23 DIAGNOSIS — G8929 Other chronic pain: Secondary | ICD-10-CM | POA: Diagnosis not present

## 2021-03-31 NOTE — Progress Notes (Signed)
HPI: FU AS and palpitations; cardiac catheterization May 2012 showed normal LV function and no obstructive coronary disease; mild AS with AVA 1.2 cm2. Holter monitor July 2016 showed sinus rhythm with first-degree AV block, PACs, PVCs and brief PAT.Last echocardiogram September 2021 showed normal LV function, grade 2 diastolic dysfunction, mild mitral regurgitation, mild aortic insufficiency, mild to moderate aortic stenosis with mean gradient 17 mmHg.Since last seenshe denies dyspnea, chest pain, palpitations or syncope.  Current Outpatient Medications  Medication Sig Dispense Refill  . acetaminophen (TYLENOL) 650 MG CR tablet Take 650 mg by mouth every 8 (eight) hours as needed for pain.    Marland Kitchen amLODipine (NORVASC) 10 MG tablet Take 1 tablet (10 mg total) by mouth daily. 90 tablet 0  . atorvastatin (LIPITOR) 20 MG tablet Take 20 mg by mouth daily.    . busPIRone (BUSPAR) 5 MG tablet Take 1 tablet by mouth 2 (two) times daily.    . calcium-vitamin D (OSCAL WITH D) 500-200 MG-UNIT per tablet Take 1 tablet by mouth 2 (two) times daily.    . cholecalciferol (VITAMIN D) 1000 UNITS tablet Take 1,000 Units by mouth daily.    . hydrochlorothiazide (HYDRODIURIL) 25 MG tablet Take 25 mg by mouth daily.    Marland Kitchen lisinopril (PRINIVIL,ZESTRIL) 10 MG tablet Take 10 mg by mouth daily.    . metoprolol succinate (TOPROL-XL) 25 MG 24 hr tablet Take 1 tablet (25 mg total) by mouth daily. 90 tablet 0  . MULTIPLE VITAMIN PO one tablet    . Multiple Vitamins-Minerals (ICAPS PO) Take 1 tablet by mouth 2 (two) times daily.    Loma Boston Calcium 500 MG TABS one tablet    . zolpidem (AMBIEN) 10 MG tablet Take 10 mg by mouth at bedtime as needed for sleep.      No current facility-administered medications for this visit.     Past Medical History:  Diagnosis Date  . Aortic stenosis   . Essential hypertension 05/30/2015  . Hyperlipidemia   . Hypertension   . Irregular heartbeat 06/02/2015  . Macular  degeneration of both eyes    DRY, RIGHT EYE WORSE THAN LEFT  . Palpitations 05/30/2015  . PSVT (paroxysmal supraventricular tachycardia) ([place]) 07/23/2015  . PVC's (premature ventricular contractions) 07/23/2015    Past Surgical History:  Procedure Laterality Date  . CHOLECYSTECTOMY  10 YRS AGO  . COLONOSCOPY WITH PROPOFOL N/A 05/07/2014   Procedure: COLONOSCOPY WITH PROPOFOL;  Surgeon: Garlan Fair, MD;  Location: [facility];  Service: Endoscopy;  Laterality: N/A;  . EYE SURGERY Bilateral LAST FEW YEARS   LENS REPLACEMENT  FOR CATARACTS  . LAPAROSCOPY  YRS AGO  . TONSILLECTOMY  AS CHILD    Social History   Socioeconomic History  . Marital status: Married    Spouse name: Not on file  . Number of children: 1  . Years of education: Not on file  . Highest education level: Not on file  Occupational History  . Not on file  Tobacco Use  . Smoking status: Former Smoker    Packs/day: 1.00    Types: Cigarettes    Quit date: 11/08/1992    Years since quitting: 28.4  . Smokeless tobacco: Never Used  Vaping Use  . Vaping Use: Never used  Substance and Sexual Activity  . Alcohol use: Yes    Alcohol/week: 0.0 standard drinks    Comment: RARE  . Drug use: No  . Sexual activity: Not on file  Other Topics  Concern  . Not on file  Social History Narrative  . Not on file   Social Determinants of Health   Financial Resource Strain: Not on file  Food Insecurity: Not on file  Transportation Needs: Not on file  Physical Activity: Not on file  Stress: Not on file  Social Connections: Not on file  Intimate Partner Violence: Not on file    Family History  Problem Relation Age of Onset  . Cancer Mother   . CAD Mother        CABG 67s  . COPD Father     ROS: no fevers or chills, productive cough, hemoptysis, dysphasia, odynophagia, melena, hematochezia, dysuria, hematuria, rash, seizure activity, orthopnea, PND, pedal edema, claudication. Remaining systems are  negative.  Physical Exam: Well-developed well-nourished in no acute distress.  Skin is warm and dry.  HEENT is normal.  Neck is supple.  Chest is clear to auscultation with normal expansion.  Cardiovascular exam is regular rate and rhythm. 2/6 systolic murmur Abdominal exam nontender or distended. No masses palpated. Extremities show no edema. neuro grossly intact  ECG-normal sinus rhythm, no ST changes.  Personally reviewed  A/P  1 aortic stenosis/aortic insufficiency-patient remains asymptomatic.  We will plan to repeat her echocardiogram September 2022.  She understands she will likely require aortic valve replacement in the future.  2 hypertension-blood pressure controlled.  Continue present medical regimen.  3 hyperlipidemia-continue statin.  4 history of palpitations-continue beta-blocker.  Symptoms are controlled.  Kirk Ruths, MD

## 2021-04-14 ENCOUNTER — Other Ambulatory Visit: Payer: Self-pay

## 2021-04-14 ENCOUNTER — Ambulatory Visit: Payer: PPO | Admitting: Cardiology

## 2021-04-14 ENCOUNTER — Encounter: Payer: Self-pay | Admitting: Cardiology

## 2021-04-14 VITALS — BP 132/60 | HR 63 | Ht 66.0 in | Wt 157.8 lb

## 2021-04-14 DIAGNOSIS — I350 Nonrheumatic aortic (valve) stenosis: Secondary | ICD-10-CM

## 2021-04-14 DIAGNOSIS — E7800 Pure hypercholesterolemia, unspecified: Secondary | ICD-10-CM

## 2021-04-14 DIAGNOSIS — R002 Palpitations: Secondary | ICD-10-CM | POA: Diagnosis not present

## 2021-04-14 DIAGNOSIS — I10 Essential (primary) hypertension: Secondary | ICD-10-CM

## 2021-04-14 NOTE — Patient Instructions (Signed)
  Testing/Procedures:  Your physician has requested that you have an echocardiogram. Echocardiography is a painless test that uses sound waves to create images of your heart. It provides your doctor with information about the size and shape of your heart and how well your heart's chambers and valves are working. This procedure takes approximately one hour. There are no restrictions for this procedure.[name]     Follow-Up: At [facility], you and your health needs are our priority.  As part of our continuing mission to provide you with exceptional heart care, we have created designated Provider Care Teams.  These Care Teams include your primary Cardiologist (physician) and Advanced Practice Providers (APPs -  Physician Assistants and Nurse Practitioners) who all work together to provide you with the care you need, when you need it.  We recommend signing up for the patient portal called "MyChart".  Sign up information is provided on this After Visit Summary.  MyChart is used to connect with patients for Virtual Visits (Telemedicine).  Patients are able to view lab/test results, encounter notes, upcoming appointments, etc.  Non-urgent messages can be sent to your provider as well.   To learn more about what you can do with MyChart, go to [email or web address].    Your next appointment:   6 month(s)  The format for your next appointment:   In Person  Provider:   Kirk Ruths, MD

## 2021-04-17 DIAGNOSIS — I10 Essential (primary) hypertension: Secondary | ICD-10-CM | POA: Diagnosis not present

## 2021-04-17 DIAGNOSIS — G8929 Other chronic pain: Secondary | ICD-10-CM | POA: Diagnosis not present

## 2021-04-17 DIAGNOSIS — F4321 Adjustment disorder with depressed mood: Secondary | ICD-10-CM | POA: Diagnosis not present

## 2021-04-17 DIAGNOSIS — E7800 Pure hypercholesterolemia, unspecified: Secondary | ICD-10-CM | POA: Diagnosis not present

## 2021-04-30 DIAGNOSIS — H353122 Nonexudative age-related macular degeneration, left eye, intermediate dry stage: Secondary | ICD-10-CM | POA: Diagnosis not present

## 2021-04-30 DIAGNOSIS — H353211 Exudative age-related macular degeneration, right eye, with active choroidal neovascularization: Secondary | ICD-10-CM | POA: Diagnosis not present

## 2021-05-05 ENCOUNTER — Other Ambulatory Visit (HOSPITAL_COMMUNITY): Payer: PPO

## 2021-05-26 DIAGNOSIS — I471 Supraventricular tachycardia: Secondary | ICD-10-CM | POA: Diagnosis not present

## 2021-05-26 DIAGNOSIS — I10 Essential (primary) hypertension: Secondary | ICD-10-CM | POA: Diagnosis not present

## 2021-05-26 DIAGNOSIS — I350 Nonrheumatic aortic (valve) stenosis: Secondary | ICD-10-CM | POA: Diagnosis not present

## 2021-06-18 DIAGNOSIS — H353211 Exudative age-related macular degeneration, right eye, with active choroidal neovascularization: Secondary | ICD-10-CM | POA: Diagnosis not present

## 2021-06-18 DIAGNOSIS — H35423 Microcystoid degeneration of retina, bilateral: Secondary | ICD-10-CM | POA: Diagnosis not present

## 2021-06-18 DIAGNOSIS — H43813 Vitreous degeneration, bilateral: Secondary | ICD-10-CM | POA: Diagnosis not present

## 2021-06-18 DIAGNOSIS — H353122 Nonexudative age-related macular degeneration, left eye, intermediate dry stage: Secondary | ICD-10-CM | POA: Diagnosis not present

## 2021-07-02 DIAGNOSIS — G8929 Other chronic pain: Secondary | ICD-10-CM | POA: Diagnosis not present

## 2021-07-02 DIAGNOSIS — I10 Essential (primary) hypertension: Secondary | ICD-10-CM | POA: Diagnosis not present

## 2021-07-02 DIAGNOSIS — E7800 Pure hypercholesterolemia, unspecified: Secondary | ICD-10-CM | POA: Diagnosis not present

## 2021-07-02 DIAGNOSIS — F4321 Adjustment disorder with depressed mood: Secondary | ICD-10-CM | POA: Diagnosis not present

## 2021-07-07 ENCOUNTER — Other Ambulatory Visit: Payer: Self-pay | Admitting: Obstetrics and Gynecology

## 2021-07-07 DIAGNOSIS — Z1231 Encounter for screening mammogram for malignant neoplasm of breast: Secondary | ICD-10-CM

## 2021-07-08 ENCOUNTER — Telehealth: Payer: Self-pay | Admitting: Cardiology

## 2021-07-08 MED ORDER — AMLODIPINE BESYLATE 10 MG PO TABS: 10.0000 mg | ORAL_TABLET | Freq: Every day | ORAL | 1 refills | Status: DC

## 2021-07-08 NOTE — Telephone Encounter (Signed)
*  STAT* If patient is at the pharmacy, call can be transferred to refill team.   1. Which medications need to be refilled? (please list name of each medication and dose if known) amLODipine (NORVASC) 10 MG tablet  2. Which pharmacy/location (including street and city if local pharmacy) is medication to be sent to? [facility] - [place], [place] - [place] [address]   3. Do they need a 30 day or 90 day supply? 90ds

## 2021-07-15 DIAGNOSIS — I10 Essential (primary) hypertension: Secondary | ICD-10-CM | POA: Diagnosis not present

## 2021-07-15 DIAGNOSIS — E7800 Pure hypercholesterolemia, unspecified: Secondary | ICD-10-CM | POA: Diagnosis not present

## 2021-07-15 DIAGNOSIS — F4321 Adjustment disorder with depressed mood: Secondary | ICD-10-CM | POA: Diagnosis not present

## 2021-07-16 ENCOUNTER — Other Ambulatory Visit: Payer: Self-pay | Admitting: Cardiology

## 2021-08-05 ENCOUNTER — Other Ambulatory Visit (HOSPITAL_COMMUNITY): Payer: PPO

## 2021-08-06 DIAGNOSIS — H35031 Hypertensive retinopathy, right eye: Secondary | ICD-10-CM | POA: Diagnosis not present

## 2021-08-06 DIAGNOSIS — H353211 Exudative age-related macular degeneration, right eye, with active choroidal neovascularization: Secondary | ICD-10-CM | POA: Diagnosis not present

## 2021-08-06 DIAGNOSIS — H43813 Vitreous degeneration, bilateral: Secondary | ICD-10-CM | POA: Diagnosis not present

## 2021-08-06 DIAGNOSIS — H353122 Nonexudative age-related macular degeneration, left eye, intermediate dry stage: Secondary | ICD-10-CM | POA: Diagnosis not present

## 2021-08-13 DIAGNOSIS — I10 Essential (primary) hypertension: Secondary | ICD-10-CM | POA: Diagnosis not present

## 2021-08-13 DIAGNOSIS — Z79899 Other long term (current) drug therapy: Secondary | ICD-10-CM | POA: Diagnosis not present

## 2021-08-13 DIAGNOSIS — Z1389 Encounter for screening for other disorder: Secondary | ICD-10-CM | POA: Diagnosis not present

## 2021-08-13 DIAGNOSIS — E7800 Pure hypercholesterolemia, unspecified: Secondary | ICD-10-CM | POA: Diagnosis not present

## 2021-08-13 DIAGNOSIS — I350 Nonrheumatic aortic (valve) stenosis: Secondary | ICD-10-CM | POA: Diagnosis not present

## 2021-08-13 DIAGNOSIS — Z0000 Encounter for general adult medical examination without abnormal findings: Secondary | ICD-10-CM | POA: Diagnosis not present

## 2021-08-13 DIAGNOSIS — F411 Generalized anxiety disorder: Secondary | ICD-10-CM | POA: Diagnosis not present

## 2021-08-13 DIAGNOSIS — I471 Supraventricular tachycardia: Secondary | ICD-10-CM | POA: Diagnosis not present

## 2021-08-13 DIAGNOSIS — H353211 Exudative age-related macular degeneration, right eye, with active choroidal neovascularization: Secondary | ICD-10-CM | POA: Diagnosis not present

## 2021-08-14 ENCOUNTER — Other Ambulatory Visit: Payer: Self-pay

## 2021-08-14 ENCOUNTER — Ambulatory Visit
Admission: RE | Admit: 2021-08-14 | Discharge: 2021-08-14 | Disposition: A | Discharge status: Home / Self Care | Payer: PPO | Source: Ambulatory Visit | Attending: Obstetrics and Gynecology | Admitting: Obstetrics and Gynecology

## 2021-08-14 DIAGNOSIS — Z1231 Encounter for screening mammogram for malignant neoplasm of breast: Secondary | ICD-10-CM

## 2021-08-18 ENCOUNTER — Ambulatory Visit (HOSPITAL_COMMUNITY): Payer: PPO | Attending: Cardiovascular Disease

## 2021-08-18 ENCOUNTER — Other Ambulatory Visit: Payer: Self-pay

## 2021-08-18 DIAGNOSIS — I350 Nonrheumatic aortic (valve) stenosis: Secondary | ICD-10-CM | POA: Insufficient documentation

## 2021-08-18 LAB — ECHOCARDIOGRAM COMPLETE
AR max vel: 1.16 cm2
AV Area VTI: 1.12 cm2
AV Area mean vel: 1.21 cm2
AV Mean grad: 15 mmHg
AV Peak grad: 30.5 mmHg
Ao pk vel: 2.76 m/s
Area-P 1/2: 3.54 cm2
P 1/2 time: 276 msec
S' Lateral: 2.4 cm

## 2021-08-20 ENCOUNTER — Other Ambulatory Visit: Payer: Self-pay | Admitting: Obstetrics and Gynecology

## 2021-08-20 DIAGNOSIS — R928 Other abnormal and inconclusive findings on diagnostic imaging of breast: Secondary | ICD-10-CM

## 2021-08-24 DIAGNOSIS — Z01419 Encounter for gynecological examination (general) (routine) without abnormal findings: Secondary | ICD-10-CM | POA: Diagnosis not present

## 2021-08-24 DIAGNOSIS — Z6827 Body mass index (BMI) 27.0-27.9, adult: Secondary | ICD-10-CM | POA: Diagnosis not present

## 2021-09-07 ENCOUNTER — Ambulatory Visit
Admission: RE | Admit: 2021-09-07 | Discharge: 2021-09-07 | Disposition: A | Discharge status: Home / Self Care | Payer: PPO | Source: Ambulatory Visit | Attending: Obstetrics and Gynecology | Admitting: Obstetrics and Gynecology

## 2021-09-07 ENCOUNTER — Ambulatory Visit: Payer: PPO

## 2021-09-07 ENCOUNTER — Other Ambulatory Visit: Payer: Self-pay

## 2021-09-07 DIAGNOSIS — R928 Other abnormal and inconclusive findings on diagnostic imaging of breast: Secondary | ICD-10-CM

## 2021-09-07 DIAGNOSIS — R922 Inconclusive mammogram: Secondary | ICD-10-CM | POA: Diagnosis not present

## 2021-09-18 DIAGNOSIS — L821 Other seborrheic keratosis: Secondary | ICD-10-CM | POA: Diagnosis not present

## 2021-09-18 DIAGNOSIS — D485 Neoplasm of uncertain behavior of skin: Secondary | ICD-10-CM | POA: Diagnosis not present

## 2021-09-18 DIAGNOSIS — L570 Actinic keratosis: Secondary | ICD-10-CM | POA: Diagnosis not present

## 2021-09-18 DIAGNOSIS — L814 Other melanin hyperpigmentation: Secondary | ICD-10-CM | POA: Diagnosis not present

## 2021-09-24 DIAGNOSIS — H353211 Exudative age-related macular degeneration, right eye, with active choroidal neovascularization: Secondary | ICD-10-CM | POA: Diagnosis not present

## 2021-09-24 DIAGNOSIS — H353122 Nonexudative age-related macular degeneration, left eye, intermediate dry stage: Secondary | ICD-10-CM | POA: Diagnosis not present

## 2021-09-24 DIAGNOSIS — H43813 Vitreous degeneration, bilateral: Secondary | ICD-10-CM | POA: Diagnosis not present

## 2021-09-24 DIAGNOSIS — H35423 Microcystoid degeneration of retina, bilateral: Secondary | ICD-10-CM | POA: Diagnosis not present

## 2021-10-08 NOTE — Progress Notes (Signed)
HPI: FU AS and palpitations; cardiac catheterization May 2012 showed normal LV function and no obstructive coronary disease; mild AS with AVA 1.2 cm2. Holter monitor July 2016 showed sinus rhythm with first-degree AV block, PACs, PVCs and brief PAT.  Most recent echocardiogram October 2022 showed normal LV function, mild mitral regurgitation, mild to moderate aortic stenosis with mean gradient 15 mmHg, mild aortic insufficiency.  Since last seen there is no increased dyspnea, chest pain, palpitations or syncope.  Current Outpatient Medications  Medication Sig Dispense Refill   acetaminophen (TYLENOL) 325 MG tablet Tylenol 325 mg tablet  Take 2 tablets every 6 hours by oral route.     amLODipine (NORVASC) 10 MG tablet Take 1 tablet (10 mg total) by mouth daily. 90 tablet 1   atorvastatin (LIPITOR) 20 MG tablet Take 20 mg by mouth daily.     busPIRone (BUSPAR) 5 MG tablet Take 1 tablet by mouth 2 (two) times daily.     calcium-vitamin D (OSCAL WITH D) 500-200 MG-UNIT per tablet Take 1 tablet by mouth 2 (two) times daily.     cholecalciferol (VITAMIN D) 1000 UNITS tablet Take 1,000 Units by mouth daily.     hydrochlorothiazide (HYDRODIURIL) 25 MG tablet Take 25 mg by mouth daily.     lisinopril (PRINIVIL,ZESTRIL) 10 MG tablet Take 10 mg by mouth daily.     metoprolol succinate (TOPROL-XL) 25 MG 24 hr tablet TAKE ONE TABLET BY MOUTH ONCE DAILY 90 tablet 0   MULTIPLE VITAMIN PO one tablet     Multiple Vitamins-Minerals (ICAPS PO) Take 1 tablet by mouth 2 (two) times daily.     zolpidem (AMBIEN) 10 MG tablet Take 10 mg by mouth at bedtime as needed for sleep.      acetaminophen (TYLENOL) 650 MG CR tablet Take 650 mg by mouth every 8 (eight) hours as needed for pain. (Patient not taking: Reported on 10/14/2021)     Multiple Vitamins-Minerals (CENTRUM SILVER 50+WOMEN PO) Take by mouth daily.     [place] Calcium 500 MG TABS one tablet (Patient not taking: Reported on 10/14/2021)     No  current facility-administered medications for this visit.     Past Medical History:  Diagnosis Date   Aortic stenosis    Essential hypertension 05/30/2015   Hyperlipidemia    Hypertension    Irregular heartbeat 06/02/2015   Macular degeneration of both eyes    DRY, RIGHT EYE WORSE THAN LEFT   Palpitations 05/30/2015   PSVT (paroxysmal supraventricular tachycardia) ([place]) 07/23/2015   PVC's (premature ventricular contractions) 07/23/2015    Past Surgical History:  Procedure Laterality Date   CHOLECYSTECTOMY  10 YRS AGO   COLONOSCOPY WITH PROPOFOL N/A 05/07/2014   Procedure: COLONOSCOPY WITH PROPOFOL;  Surgeon: Garlan Fair, MD;  Location: [facility];  Service: Endoscopy;  Laterality: N/A;   EYE SURGERY Bilateral LAST FEW YEARS   LENS REPLACEMENT  FOR CATARACTS   LAPAROSCOPY  YRS AGO   TONSILLECTOMY  AS CHILD    Social History   Socioeconomic History   Marital status: Married    Spouse name: Not on file   Number of children: 1   Years of education: Not on file   Highest education level: Not on file  Occupational History   Not on file  Tobacco Use   Smoking status: Former    Packs/day: 1.00    Types: Cigarettes    Quit date: 11/08/1992    Years since quitting: 50.9  Smokeless tobacco: Never  Vaping Use   Vaping Use: Never used  Substance and Sexual Activity   Alcohol use: Yes    Alcohol/week: 0.0 standard drinks    Comment: RARE   Drug use: No   Sexual activity: Not on file  Other Topics Concern   Not on file  Social History Narrative   Not on file   Social Determinants of Health   Financial Resource Strain: Not on file  Food Insecurity: Not on file  Transportation Needs: Not on file  Physical Activity: Not on file  Stress: Not on file  Social Connections: Not on file  Intimate Partner Violence: Not on file    Family History  Problem Relation Age of Onset   Cancer Mother    CAD Mother        CABG 53s   COPD Father     ROS: no fevers or chills,  productive cough, hemoptysis, dysphasia, odynophagia, melena, hematochezia, dysuria, hematuria, rash, seizure activity, orthopnea, PND, pedal edema, claudication. Remaining systems are negative.  Physical Exam: Well-developed well-nourished in no acute distress.  Skin is warm and dry.  HEENT is normal.  Neck is supple.  Chest is clear to auscultation with normal expansion.  Cardiovascular exam is regular rate and rhythm.  3/6 systolic murmur left sternal border.  No diastolic murmur.  S2 is not diminished. Abdominal exam nontender or distended. No masses palpated. Extremities show no edema. neuro grossly intact  A/P  1 aortic stenosis/aortic insufficiency-patient will need follow-up echocardiogram October 2023.  She understands she will likely require aortic valve replacement in the future.  2 palpitations-symptoms controlled.  Continue beta-blocker at present dose.  3 hypertension-blood pressure controlled.  Continue present medications.  4 hyperlipidemia-continue statin.  Kirk Ruths, MD

## 2021-10-13 DIAGNOSIS — I10 Essential (primary) hypertension: Secondary | ICD-10-CM | POA: Diagnosis not present

## 2021-10-13 DIAGNOSIS — F4321 Adjustment disorder with depressed mood: Secondary | ICD-10-CM | POA: Diagnosis not present

## 2021-10-13 DIAGNOSIS — E7800 Pure hypercholesterolemia, unspecified: Secondary | ICD-10-CM | POA: Diagnosis not present

## 2021-10-14 ENCOUNTER — Other Ambulatory Visit: Payer: Self-pay

## 2021-10-14 ENCOUNTER — Encounter: Payer: Self-pay | Admitting: Cardiology

## 2021-10-14 ENCOUNTER — Ambulatory Visit: Payer: PPO | Admitting: Cardiology

## 2021-10-14 VITALS — BP 140/85 | HR 66 | Ht 65.0 in | Wt 160.4 lb

## 2021-10-14 DIAGNOSIS — I350 Nonrheumatic aortic (valve) stenosis: Secondary | ICD-10-CM

## 2021-10-14 DIAGNOSIS — I10 Essential (primary) hypertension: Secondary | ICD-10-CM

## 2021-10-14 DIAGNOSIS — R002 Palpitations: Secondary | ICD-10-CM

## 2021-10-14 DIAGNOSIS — E7800 Pure hypercholesterolemia, unspecified: Secondary | ICD-10-CM

## 2021-10-14 NOTE — Patient Instructions (Signed)

## 2021-11-05 DIAGNOSIS — H353211 Exudative age-related macular degeneration, right eye, with active choroidal neovascularization: Secondary | ICD-10-CM | POA: Diagnosis not present

## 2021-11-05 DIAGNOSIS — H353122 Nonexudative age-related macular degeneration, left eye, intermediate dry stage: Secondary | ICD-10-CM | POA: Diagnosis not present

## 2021-12-17 DIAGNOSIS — H353121 Nonexudative age-related macular degeneration, left eye, early dry stage: Secondary | ICD-10-CM | POA: Diagnosis not present

## 2021-12-17 DIAGNOSIS — H43813 Vitreous degeneration, bilateral: Secondary | ICD-10-CM | POA: Diagnosis not present

## 2021-12-17 DIAGNOSIS — H353211 Exudative age-related macular degeneration, right eye, with active choroidal neovascularization: Secondary | ICD-10-CM | POA: Diagnosis not present

## 2021-12-17 DIAGNOSIS — H43391 Other vitreous opacities, right eye: Secondary | ICD-10-CM | POA: Diagnosis not present

## 2022-01-03 ENCOUNTER — Other Ambulatory Visit: Payer: Self-pay | Admitting: Cardiology

## 2022-01-11 DIAGNOSIS — I10 Essential (primary) hypertension: Secondary | ICD-10-CM | POA: Diagnosis not present

## 2022-01-11 DIAGNOSIS — E7800 Pure hypercholesterolemia, unspecified: Secondary | ICD-10-CM | POA: Diagnosis not present

## 2022-02-01 DIAGNOSIS — H353121 Nonexudative age-related macular degeneration, left eye, early dry stage: Secondary | ICD-10-CM | POA: Diagnosis not present

## 2022-02-01 DIAGNOSIS — H353211 Exudative age-related macular degeneration, right eye, with active choroidal neovascularization: Secondary | ICD-10-CM | POA: Diagnosis not present

## 2022-02-15 DIAGNOSIS — I471 Supraventricular tachycardia: Secondary | ICD-10-CM | POA: Diagnosis not present

## 2022-02-15 DIAGNOSIS — Z79899 Other long term (current) drug therapy: Secondary | ICD-10-CM | POA: Diagnosis not present

## 2022-02-15 DIAGNOSIS — H353211 Exudative age-related macular degeneration, right eye, with active choroidal neovascularization: Secondary | ICD-10-CM | POA: Diagnosis not present

## 2022-02-15 DIAGNOSIS — I10 Essential (primary) hypertension: Secondary | ICD-10-CM | POA: Diagnosis not present

## 2022-03-10 DIAGNOSIS — Z961 Presence of intraocular lens: Secondary | ICD-10-CM | POA: Diagnosis not present

## 2022-03-10 DIAGNOSIS — H524 Presbyopia: Secondary | ICD-10-CM | POA: Diagnosis not present

## 2022-03-10 DIAGNOSIS — H353122 Nonexudative age-related macular degeneration, left eye, intermediate dry stage: Secondary | ICD-10-CM | POA: Diagnosis not present

## 2022-03-10 DIAGNOSIS — H353211 Exudative age-related macular degeneration, right eye, with active choroidal neovascularization: Secondary | ICD-10-CM | POA: Diagnosis not present

## 2022-03-25 DIAGNOSIS — H43813 Vitreous degeneration, bilateral: Secondary | ICD-10-CM | POA: Diagnosis not present

## 2022-03-25 DIAGNOSIS — H35423 Microcystoid degeneration of retina, bilateral: Secondary | ICD-10-CM | POA: Diagnosis not present

## 2022-03-25 DIAGNOSIS — H353121 Nonexudative age-related macular degeneration, left eye, early dry stage: Secondary | ICD-10-CM | POA: Diagnosis not present

## 2022-03-25 DIAGNOSIS — H353211 Exudative age-related macular degeneration, right eye, with active choroidal neovascularization: Secondary | ICD-10-CM | POA: Diagnosis not present

## 2022-04-13 NOTE — Progress Notes (Signed)
HPI:FU AS and palpitations; cardiac catheterization May 2012 showed normal LV function and no obstructive coronary disease; mild AS with AVA 1.2 cm2. Holter monitor July 2016 showed sinus rhythm with first-degree AV block, PACs, PVCs and brief PAT.  Most recent echocardiogram October 2022 showed normal LV function, mild mitral regurgitation, mild to moderate aortic stenosis with mean gradient 15 mmHg, mild aortic insufficiency.  Since last seen she denies dyspnea, chest pain, palpitations or syncope.  No pedal edema.  Current Outpatient Medications  Medication Sig Dispense Refill   acetaminophen (TYLENOL) 325 MG tablet Tylenol 325 mg tablet  Take 2 tablets every 6 hours by oral route.     acetaminophen (TYLENOL) 650 MG CR tablet Take 650 mg by mouth every 8 (eight) hours as needed for pain.     amLODipine (NORVASC) 10 MG tablet TAKE ONE TABLET BY MOUTH ONCE DAILY 90 tablet 3   atorvastatin (LIPITOR) 20 MG tablet Take 20 mg by mouth daily.     calcium-vitamin D (OSCAL WITH D) 500-200 MG-UNIT per tablet Take 1 tablet by mouth 2 (two) times daily.     cholecalciferol (VITAMIN D) 1000 UNITS tablet Take 1,000 Units by mouth daily.     hydrochlorothiazide (HYDRODIURIL) 25 MG tablet Take 25 mg by mouth daily.     lisinopril (PRINIVIL,ZESTRIL) 10 MG tablet Take 10 mg by mouth daily.     metoprolol succinate (TOPROL-XL) 25 MG 24 hr tablet TAKE ONE TABLET BY MOUTH DAILY 90 tablet 3   MULTIPLE VITAMIN PO one tablet     Multiple Vitamins-Minerals (CENTRUM SILVER 50+WOMEN PO) Take by mouth daily.     zolpidem (AMBIEN) 10 MG tablet Take 10 mg by mouth at bedtime as needed for sleep.      busPIRone (BUSPAR) 5 MG tablet Take 1 tablet by mouth 2 (two) times daily. (Patient not taking: Reported on 04/16/2022)     Multiple Vitamins-Minerals (ICAPS PO) Take 1 tablet by mouth 2 (two) times daily. (Patient not taking: Reported on 04/16/2022)     Loma Boston Calcium 500 MG TABS      No current  facility-administered medications for this visit.     Past Medical History:  Diagnosis Date   Aortic stenosis    Essential hypertension 05/30/2015   Hyperlipidemia    Hypertension    Irregular heartbeat 06/02/2015   Macular degeneration of both eyes    DRY, RIGHT EYE WORSE THAN LEFT   Palpitations 05/30/2015   PSVT (paroxysmal supraventricular tachycardia) ([place]) 07/23/2015   PVC's (premature ventricular contractions) 07/23/2015    Past Surgical History:  Procedure Laterality Date   CHOLECYSTECTOMY  10 YRS AGO   COLONOSCOPY WITH PROPOFOL N/A 05/07/2014   Procedure: COLONOSCOPY WITH PROPOFOL;  Surgeon: Garlan Fair, MD;  Location: [facility];  Service: Endoscopy;  Laterality: N/A;   EYE SURGERY Bilateral LAST FEW YEARS   LENS REPLACEMENT  FOR CATARACTS   LAPAROSCOPY  YRS AGO   TONSILLECTOMY  AS CHILD    Social History   Socioeconomic History   Marital status: Married    Spouse name: Not on file   Number of children: 1   Years of education: Not on file   Highest education level: Not on file  Occupational History   Not on file  Tobacco Use   Smoking status: Former    Packs/day: 1.00    Types: Cigarettes    Quit date: 11/08/1992    Years since quitting: 29.4   Smokeless tobacco: Never  Vaping Use   Vaping Use: Never used  Substance and Sexual Activity   Alcohol use: Yes    Alcohol/week: 0.0 standard drinks of alcohol    Comment: RARE   Drug use: No   Sexual activity: Not on file  Other Topics Concern   Not on file  Social History Narrative   Not on file   Social Determinants of Health   Financial Resource Strain: Not on file  Food Insecurity: Not on file  Transportation Needs: Not on file  Physical Activity: Not on file  Stress: Not on file  Social Connections: Not on file  Intimate Partner Violence: Not on file    Family History  Problem Relation Age of Onset   Cancer Mother    CAD Mother        CABG 36s   COPD Father     ROS: no fevers or  chills, productive cough, hemoptysis, dysphasia, odynophagia, melena, hematochezia, dysuria, hematuria, rash, seizure activity, orthopnea, PND, pedal edema, claudication. Remaining systems are negative.  Physical Exam: Well-developed well-nourished in no acute distress.  Skin is warm and dry.  HEENT is normal.  Neck is supple.  Chest is clear to auscultation with normal expansion.  Cardiovascular exam is regular rate and rhythm.  2/6 systolic murmur left sternal border.  Murmur radiates to the carotids. Abdominal exam nontender or distended. No masses palpated. Extremities show no edema. neuro grossly intact  ECG-normal sinus rhythm at a rate of 71, no ST changes.  Personally reviewed  A/P  1 aortic stenosis/aortic insufficiency-plan follow-up echocardiogram October 2023.  Patient will likely require aortic valve replacement in the future.  2 hypertension-blood pressure controlled today.  Continue present medical regimen.  3 hyperlipidemia-continue statin.  4 palpitations-symptoms are well controlled.  Continue beta-blocker.  Kirk Ruths, MD

## 2022-04-16 ENCOUNTER — Encounter: Payer: Self-pay | Admitting: Cardiology

## 2022-04-16 ENCOUNTER — Ambulatory Visit (INDEPENDENT_AMBULATORY_CARE_PROVIDER_SITE_OTHER): Payer: PPO | Admitting: Cardiology

## 2022-04-16 VITALS — BP 140/72 | HR 70 | Ht 65.0 in | Wt 162.0 lb

## 2022-04-16 DIAGNOSIS — E7800 Pure hypercholesterolemia, unspecified: Secondary | ICD-10-CM

## 2022-04-16 DIAGNOSIS — I350 Nonrheumatic aortic (valve) stenosis: Secondary | ICD-10-CM | POA: Diagnosis not present

## 2022-04-16 DIAGNOSIS — I10 Essential (primary) hypertension: Secondary | ICD-10-CM

## 2022-04-16 DIAGNOSIS — R002 Palpitations: Secondary | ICD-10-CM

## 2022-04-16 NOTE — Patient Instructions (Signed)
  Testing/Procedures:  Your physician has requested that you have an echocardiogram. Echocardiography is a painless test that uses sound waves to create images of your heart. It provides your doctor with information about the size and shape of your heart and how well your heart's chambers and valves are working. This procedure takes approximately one hour. There are no restrictions for this procedure. [place]   Follow-Up: At [facility], you and your health needs are our priority.  As part of our continuing mission to provide you with exceptional heart care, we have created designated Provider Care Teams.  These Care Teams include your primary Cardiologist (physician) and Advanced Practice Providers (APPs -  Physician Assistants and Nurse Practitioners) who all work together to provide you with the care you need, when you need it.  We recommend signing up for the patient portal called "MyChart".  Sign up information is provided on this After Visit Summary.  MyChart is used to connect with patients for Virtual Visits (Telemedicine).  Patients are able to view lab/test results, encounter notes, upcoming appointments, etc.  Non-urgent messages can be sent to your provider as well.   To learn more about what you can do with MyChart, go to [email or web address].    Your next appointment:   12 month(s)  The format for your next appointment:   In Person  Provider:   Kirk Ruths, MD       Important Information About Sugar

## 2022-05-17 DIAGNOSIS — H353211 Exudative age-related macular degeneration, right eye, with active choroidal neovascularization: Secondary | ICD-10-CM | POA: Diagnosis not present

## 2022-05-17 DIAGNOSIS — H353121 Nonexudative age-related macular degeneration, left eye, early dry stage: Secondary | ICD-10-CM | POA: Diagnosis not present

## 2022-07-05 ENCOUNTER — Other Ambulatory Visit: Payer: Self-pay | Admitting: Obstetrics and Gynecology

## 2022-07-05 DIAGNOSIS — H353121 Nonexudative age-related macular degeneration, left eye, early dry stage: Secondary | ICD-10-CM | POA: Diagnosis not present

## 2022-07-05 DIAGNOSIS — H353211 Exudative age-related macular degeneration, right eye, with active choroidal neovascularization: Secondary | ICD-10-CM | POA: Diagnosis not present

## 2022-07-05 DIAGNOSIS — H43813 Vitreous degeneration, bilateral: Secondary | ICD-10-CM | POA: Diagnosis not present

## 2022-07-05 DIAGNOSIS — H35423 Microcystoid degeneration of retina, bilateral: Secondary | ICD-10-CM | POA: Diagnosis not present

## 2022-07-05 DIAGNOSIS — Z1231 Encounter for screening mammogram for malignant neoplasm of breast: Secondary | ICD-10-CM

## 2022-07-21 ENCOUNTER — Telehealth: Payer: Self-pay

## 2022-07-21 NOTE — Patient Outreach (Signed)
  Care Coordination   07/21/2022 Name: [name] MRN: [id number] DOB: 12/04/1940   Care Coordination Outreach Attempts:  An unsuccessful telephone outreach was attempted today to offer the patient information about available care coordination services as a benefit of their health plan.   Follow Up Plan:  Additional outreach attempts will be made to offer the patient care coordination information and services.   Encounter Outcome:  No Answer  Care Coordination Interventions Activated:  No   Care Coordination Interventions:  No, not indicated    [facility] [phone]

## 2022-07-29 NOTE — Patient Outreach (Signed)
  Care Coordination   Initial Visit Note    Name: [name] MRN: [id number] DOB: 01/31/1941  [name] is a 81 y.o. year old female who sees Wenda Low, MD for primary care. I spoke with  [name] by phone.  What matters to the patients health and wellness today?  No Concerns Expressed    Goals Addressed             This Visit's Progress    COMPLETED: Care Coordination Activities       Care Coordination Interventions: Reviewed medications and plan for disease management. Reports adhering to plan and attending medical appointments as scheduled. Denies concerns r/t medication management or prescription cost. Assessed social determinant of health barriers. Last AWV completed on 08/13/2021. Next AWV scheduled for 1016/23.        SDOH assessments and interventions completed:  Yes  SDOH Interventions Today    Flowsheet Row Most Recent Value  SDOH Interventions   Food Insecurity Interventions Intervention Not Indicated  Transportation Interventions Intervention Not Indicated        Care Coordination Interventions Activated:  Yes  Care Coordination Interventions:  Yes, provided   Follow up plan: No further intervention required.   Encounter Outcome:  Pt. Visit Completed   [place] [facility] [phone]

## 2022-07-29 NOTE — Patient Instructions (Signed)
Visit Information  Thank you for your time. It was great speaking with you! Please do not hesitate to call if you require assistance.  Following are the goals we discussed today:   Goals Addressed             This Visit's Progress    COMPLETED: Care Coordination Activities       Care Coordination Interventions: Reviewed medications and plan for disease management. Reports adhering to plan and attending medical appointments as scheduled. Denies concerns r/t medication management or prescription cost. Assessed social determinant of health barriers. Last AWV completed on 08/13/2021. Next AWV scheduled for 1016/23.        Ms. Zeiser verbalized understanding of the information discussed during the telephonic outreach. Declined need for mailed instructions or resources.  Ms. Davidoff will call if care coordination assistance is needed.   [place] [facility] [phone]

## 2022-08-16 ENCOUNTER — Ambulatory Visit (HOSPITAL_COMMUNITY): Payer: PPO | Attending: Cardiology

## 2022-08-16 DIAGNOSIS — I350 Nonrheumatic aortic (valve) stenosis: Secondary | ICD-10-CM | POA: Diagnosis not present

## 2022-08-16 LAB — ECHOCARDIOGRAM COMPLETE
AR max vel: 1.02 cm2
AV Area VTI: 1.11 cm2
AV Area mean vel: 1.08 cm2
AV Mean grad: 21.3 mmHg
AV Peak grad: 41.7 mmHg
Ao pk vel: 3.23 m/s
Area-P 1/2: 3.81 cm2
P 1/2 time: 305 msec
S' Lateral: 2.4 cm

## 2022-08-19 DIAGNOSIS — H353211 Exudative age-related macular degeneration, right eye, with active choroidal neovascularization: Secondary | ICD-10-CM | POA: Diagnosis not present

## 2022-08-19 DIAGNOSIS — H353121 Nonexudative age-related macular degeneration, left eye, early dry stage: Secondary | ICD-10-CM | POA: Diagnosis not present

## 2022-08-20 ENCOUNTER — Other Ambulatory Visit: Payer: Self-pay | Admitting: *Deleted

## 2022-08-20 DIAGNOSIS — I350 Nonrheumatic aortic (valve) stenosis: Secondary | ICD-10-CM

## 2022-08-23 DIAGNOSIS — I471 Supraventricular tachycardia, unspecified: Secondary | ICD-10-CM | POA: Diagnosis not present

## 2022-08-23 DIAGNOSIS — E7800 Pure hypercholesterolemia, unspecified: Secondary | ICD-10-CM | POA: Diagnosis not present

## 2022-08-23 DIAGNOSIS — Z0000 Encounter for general adult medical examination without abnormal findings: Secondary | ICD-10-CM | POA: Diagnosis not present

## 2022-08-23 DIAGNOSIS — I350 Nonrheumatic aortic (valve) stenosis: Secondary | ICD-10-CM | POA: Diagnosis not present

## 2022-08-23 DIAGNOSIS — F411 Generalized anxiety disorder: Secondary | ICD-10-CM | POA: Diagnosis not present

## 2022-08-23 DIAGNOSIS — R7309 Other abnormal glucose: Secondary | ICD-10-CM | POA: Diagnosis not present

## 2022-08-23 DIAGNOSIS — H353211 Exudative age-related macular degeneration, right eye, with active choroidal neovascularization: Secondary | ICD-10-CM | POA: Diagnosis not present

## 2022-08-23 DIAGNOSIS — G4700 Insomnia, unspecified: Secondary | ICD-10-CM | POA: Diagnosis not present

## 2022-08-23 DIAGNOSIS — Z1331 Encounter for screening for depression: Secondary | ICD-10-CM | POA: Diagnosis not present

## 2022-08-23 DIAGNOSIS — I10 Essential (primary) hypertension: Secondary | ICD-10-CM | POA: Diagnosis not present

## 2022-08-25 DIAGNOSIS — Z01419 Encounter for gynecological examination (general) (routine) without abnormal findings: Secondary | ICD-10-CM | POA: Diagnosis not present

## 2022-08-25 DIAGNOSIS — Z6826 Body mass index (BMI) 26.0-26.9, adult: Secondary | ICD-10-CM | POA: Diagnosis not present

## 2022-09-08 ENCOUNTER — Ambulatory Visit
Admission: RE | Admit: 2022-09-08 | Discharge: 2022-09-08 | Disposition: A | Discharge status: Home / Self Care | Payer: PPO | Source: Ambulatory Visit | Attending: Obstetrics and Gynecology | Admitting: Obstetrics and Gynecology

## 2022-09-08 DIAGNOSIS — Z1231 Encounter for screening mammogram for malignant neoplasm of breast: Secondary | ICD-10-CM | POA: Diagnosis not present

## 2022-09-23 DIAGNOSIS — L565 Disseminated superficial actinic porokeratosis (DSAP): Secondary | ICD-10-CM | POA: Diagnosis not present

## 2022-09-23 DIAGNOSIS — D1801 Hemangioma of skin and subcutaneous tissue: Secondary | ICD-10-CM | POA: Diagnosis not present

## 2022-09-23 DIAGNOSIS — L821 Other seborrheic keratosis: Secondary | ICD-10-CM | POA: Diagnosis not present

## 2022-09-23 DIAGNOSIS — L814 Other melanin hyperpigmentation: Secondary | ICD-10-CM | POA: Diagnosis not present

## 2022-09-23 DIAGNOSIS — L570 Actinic keratosis: Secondary | ICD-10-CM | POA: Diagnosis not present

## 2022-10-07 DIAGNOSIS — H35423 Microcystoid degeneration of retina, bilateral: Secondary | ICD-10-CM | POA: Diagnosis not present

## 2022-10-07 DIAGNOSIS — H353122 Nonexudative age-related macular degeneration, left eye, intermediate dry stage: Secondary | ICD-10-CM | POA: Diagnosis not present

## 2022-10-07 DIAGNOSIS — H43813 Vitreous degeneration, bilateral: Secondary | ICD-10-CM | POA: Diagnosis not present

## 2022-10-07 DIAGNOSIS — H353211 Exudative age-related macular degeneration, right eye, with active choroidal neovascularization: Secondary | ICD-10-CM | POA: Diagnosis not present

## 2022-10-07 DIAGNOSIS — H35033 Hypertensive retinopathy, bilateral: Secondary | ICD-10-CM | POA: Diagnosis not present

## 2022-10-07 DIAGNOSIS — H43393 Other vitreous opacities, bilateral: Secondary | ICD-10-CM | POA: Diagnosis not present

## 2022-11-25 DIAGNOSIS — H35033 Hypertensive retinopathy, bilateral: Secondary | ICD-10-CM | POA: Diagnosis not present

## 2022-11-25 DIAGNOSIS — H353211 Exudative age-related macular degeneration, right eye, with active choroidal neovascularization: Secondary | ICD-10-CM | POA: Diagnosis not present

## 2022-11-25 DIAGNOSIS — H43813 Vitreous degeneration, bilateral: Secondary | ICD-10-CM | POA: Diagnosis not present

## 2022-12-23 DIAGNOSIS — H43813 Vitreous degeneration, bilateral: Secondary | ICD-10-CM | POA: Diagnosis not present

## 2022-12-23 DIAGNOSIS — H35033 Hypertensive retinopathy, bilateral: Secondary | ICD-10-CM | POA: Diagnosis not present

## 2022-12-23 DIAGNOSIS — H353211 Exudative age-related macular degeneration, right eye, with active choroidal neovascularization: Secondary | ICD-10-CM | POA: Diagnosis not present

## 2022-12-23 IMAGING — MG MM DIGITAL DIAGNOSTIC UNILAT*R* W/ TOMO W/ CAD
6 series · 6 of 18 positions shown · non-contrast
Comparison: Previous exam(s).

CLINICAL DATA: 80-year-old female for further evaluation of
possible RIGHT breast distortion on screening mammogram.

EXAM:
DIGITAL DIAGNOSTIC UNILATERAL RIGHT MAMMOGRAM WITH TOMOSYNTHESIS AND
CAD
TECHNIQUE: Right digital diagnostic mammography and breast tomosynthesis was
performed. The images were evaluated with computer-aided detection.

[R CC synth-2D (1 of 2)]
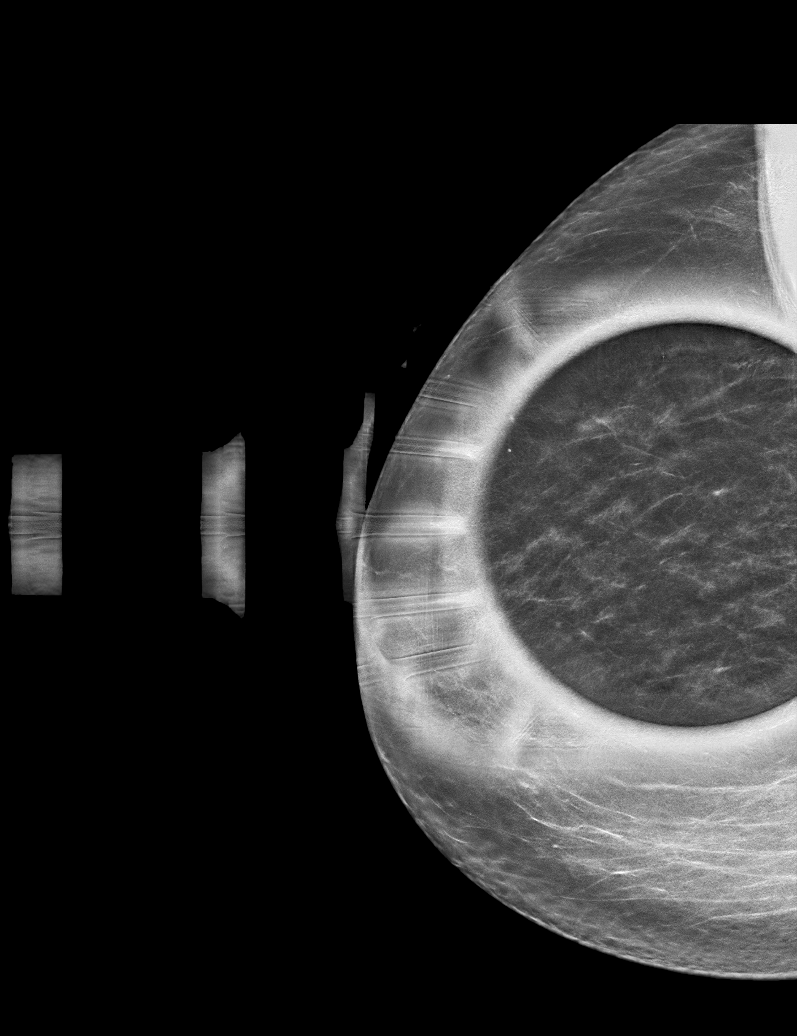

[R ML synth-2D]
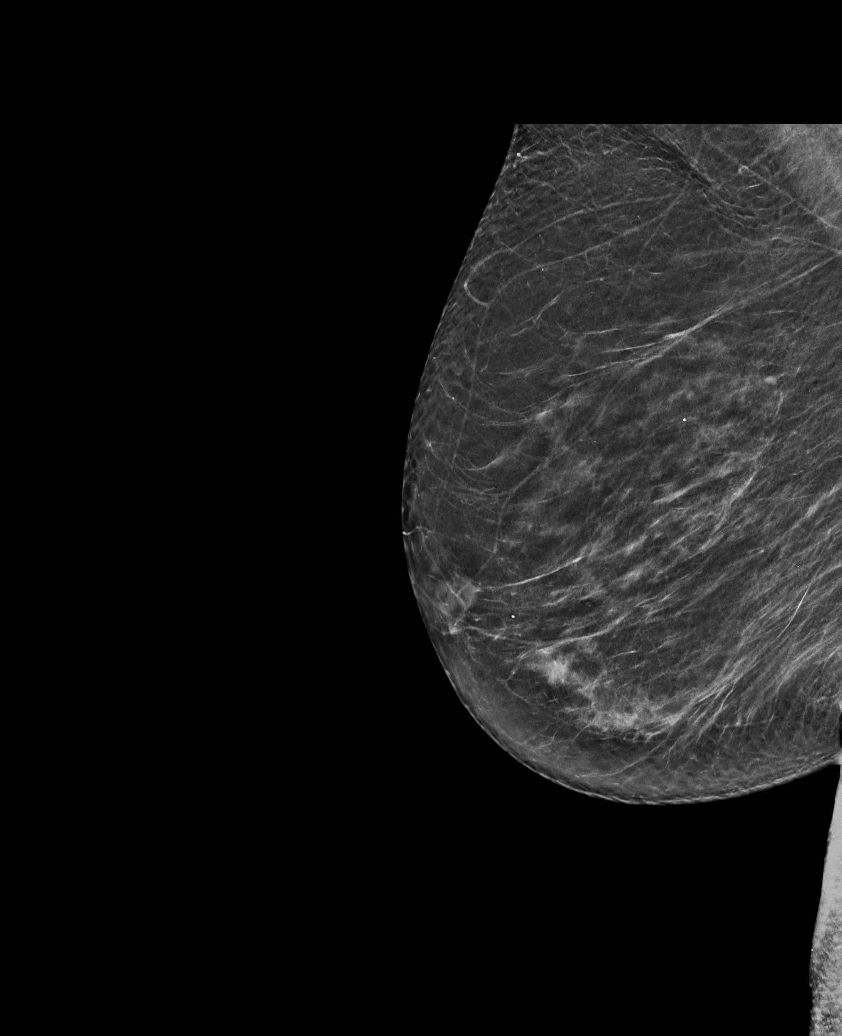

[R CC synth-2D (2 of 2)]
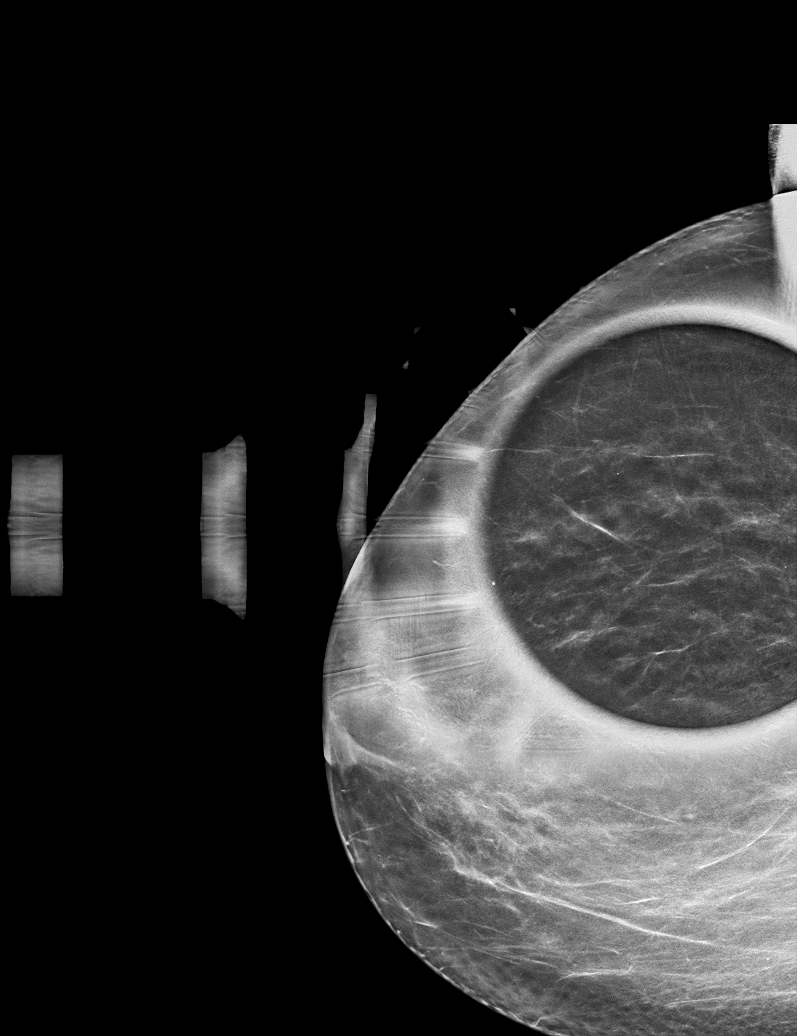

[R CC tomo (1 of 2) · tomo slice 29/57.0]
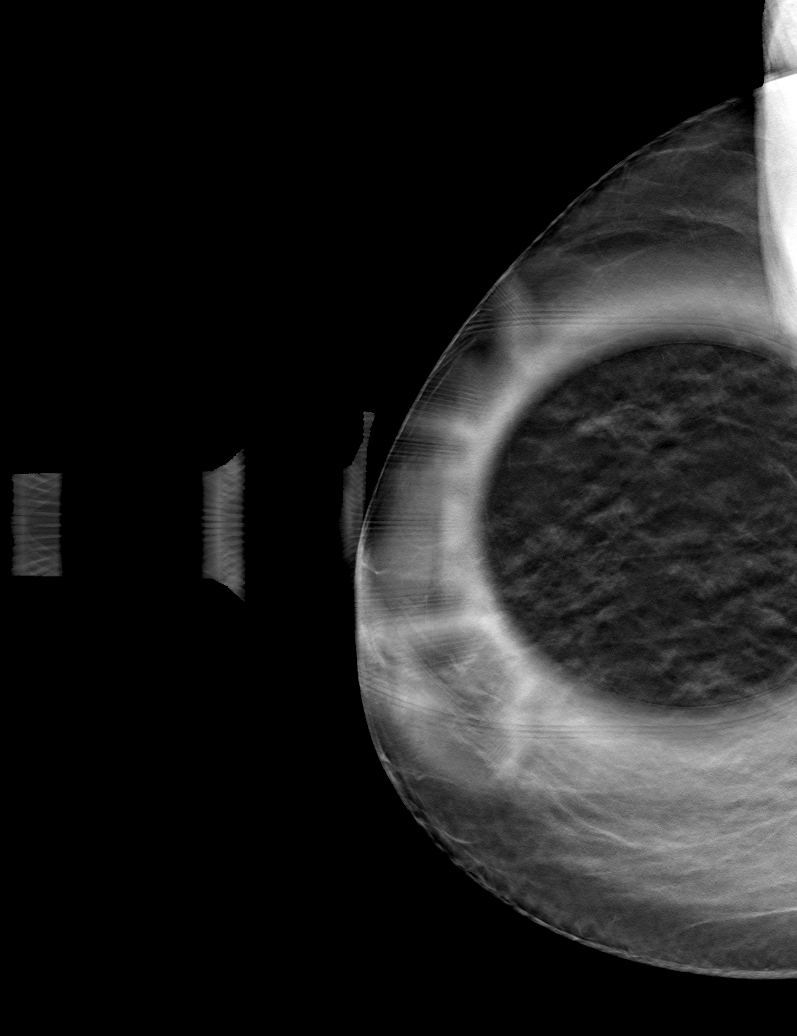

[R ML tomo · tomo slice 33/66.0]
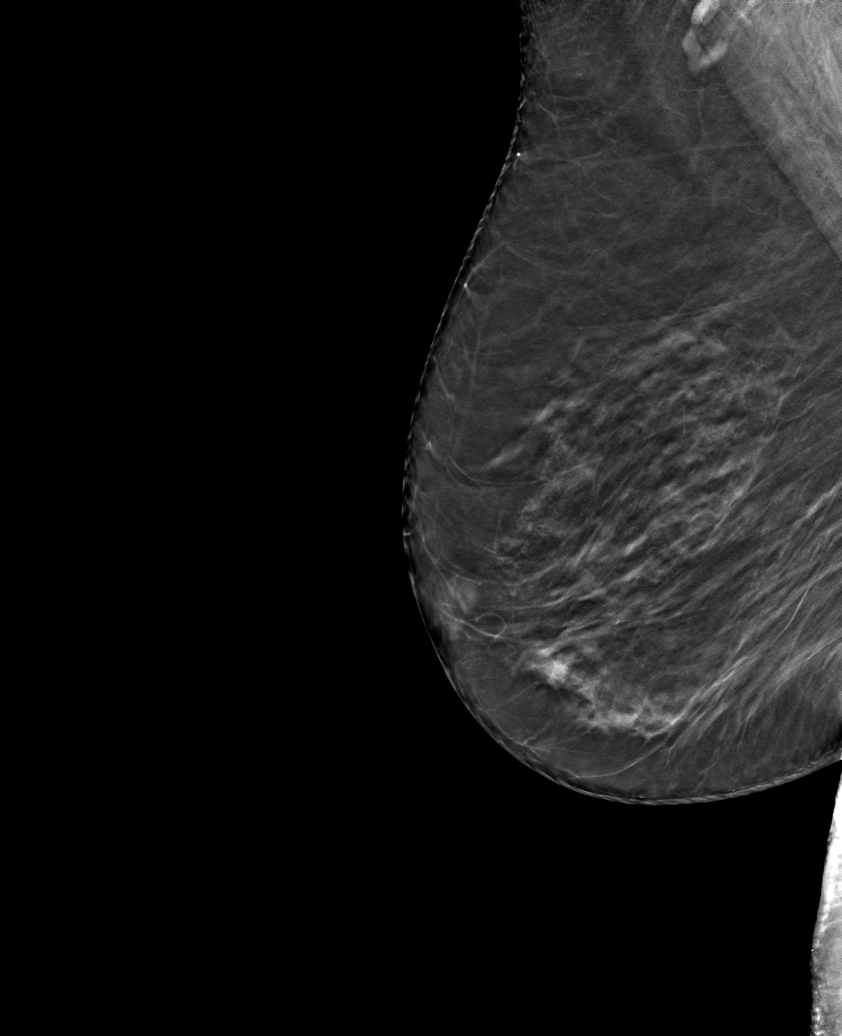

[R CC tomo (2 of 2) · tomo slice 29/58.0]
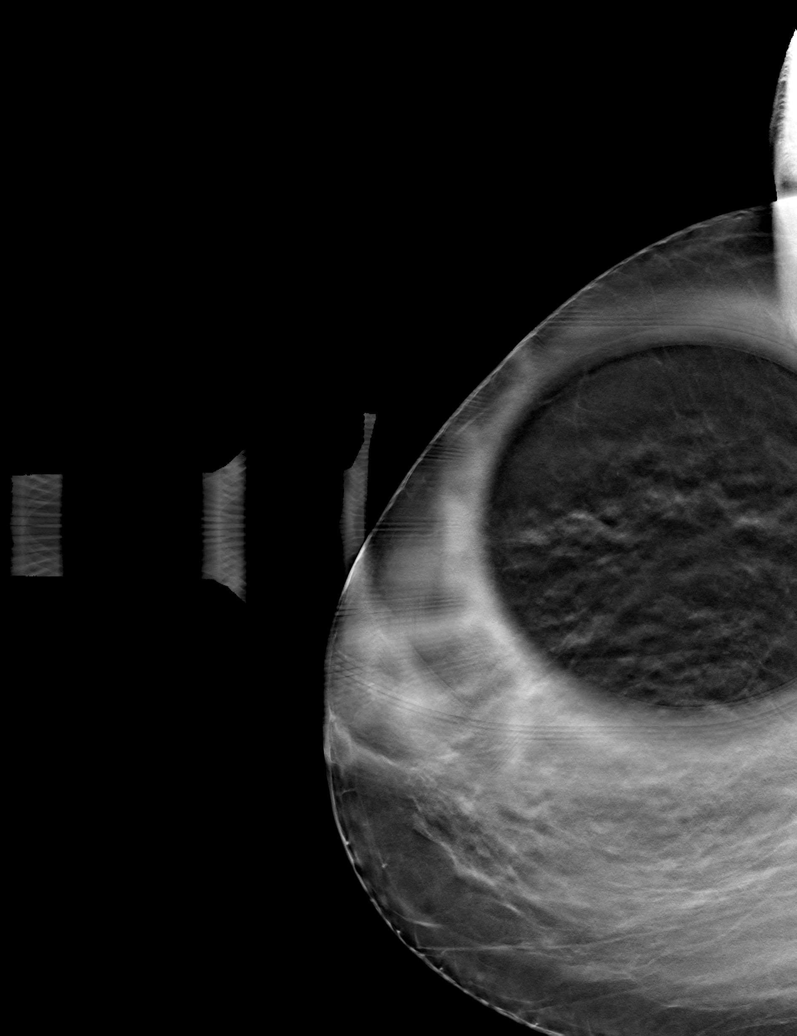

[6 of 18 positions shown; findings below may reference images not displayed]

ACR Breast Density Category b: There are scattered areas of
fibroglandular density.
FINDINGS: 2D/3D full field and spot compression views of the RIGHT breast
demonstrate no persistent distortion or suspicious abnormality at
the site of the screening study finding.
IMPRESSION: No persistent abnormality at the site of the screening study
finding.

RECOMMENDATION:
Bilateral screening mammogram in 1 year.

I have discussed the findings and recommendations with the patient.
If applicable, a reminder letter will be sent to the patient
regarding the next appointment.

BI-RADS CATEGORY  1: Negative.

## 2023-01-17 DIAGNOSIS — H353211 Exudative age-related macular degeneration, right eye, with active choroidal neovascularization: Secondary | ICD-10-CM | POA: Diagnosis not present

## 2023-01-17 DIAGNOSIS — H353123 Nonexudative age-related macular degeneration, left eye, advanced atrophic without subfoveal involvement: Secondary | ICD-10-CM | POA: Diagnosis not present

## 2023-01-17 DIAGNOSIS — H35033 Hypertensive retinopathy, bilateral: Secondary | ICD-10-CM | POA: Diagnosis not present

## 2023-01-17 DIAGNOSIS — H43813 Vitreous degeneration, bilateral: Secondary | ICD-10-CM | POA: Diagnosis not present

## 2023-02-15 DIAGNOSIS — F411 Generalized anxiety disorder: Secondary | ICD-10-CM | POA: Diagnosis not present

## 2023-02-15 DIAGNOSIS — G4700 Insomnia, unspecified: Secondary | ICD-10-CM | POA: Diagnosis not present

## 2023-02-15 DIAGNOSIS — I471 Supraventricular tachycardia, unspecified: Secondary | ICD-10-CM | POA: Diagnosis not present

## 2023-02-15 DIAGNOSIS — H353211 Exudative age-related macular degeneration, right eye, with active choroidal neovascularization: Secondary | ICD-10-CM | POA: Diagnosis not present

## 2023-02-15 DIAGNOSIS — E7800 Pure hypercholesterolemia, unspecified: Secondary | ICD-10-CM | POA: Diagnosis not present

## 2023-02-15 DIAGNOSIS — Q179 Congenital malformation of ear, unspecified: Secondary | ICD-10-CM | POA: Diagnosis not present

## 2023-02-15 DIAGNOSIS — I350 Nonrheumatic aortic (valve) stenosis: Secondary | ICD-10-CM | POA: Diagnosis not present

## 2023-02-15 DIAGNOSIS — I10 Essential (primary) hypertension: Secondary | ICD-10-CM | POA: Diagnosis not present

## 2023-03-14 DIAGNOSIS — H43813 Vitreous degeneration, bilateral: Secondary | ICD-10-CM | POA: Diagnosis not present

## 2023-03-14 DIAGNOSIS — H353123 Nonexudative age-related macular degeneration, left eye, advanced atrophic without subfoveal involvement: Secondary | ICD-10-CM | POA: Diagnosis not present

## 2023-03-14 DIAGNOSIS — H35033 Hypertensive retinopathy, bilateral: Secondary | ICD-10-CM | POA: Diagnosis not present

## 2023-03-14 DIAGNOSIS — H353211 Exudative age-related macular degeneration, right eye, with active choroidal neovascularization: Secondary | ICD-10-CM | POA: Diagnosis not present

## 2023-03-23 DIAGNOSIS — H5213 Myopia, bilateral: Secondary | ICD-10-CM | POA: Diagnosis not present

## 2023-03-23 DIAGNOSIS — Z961 Presence of intraocular lens: Secondary | ICD-10-CM | POA: Diagnosis not present

## 2023-05-09 DIAGNOSIS — H43813 Vitreous degeneration, bilateral: Secondary | ICD-10-CM | POA: Diagnosis not present

## 2023-05-09 DIAGNOSIS — H35033 Hypertensive retinopathy, bilateral: Secondary | ICD-10-CM | POA: Diagnosis not present

## 2023-05-09 DIAGNOSIS — H353123 Nonexudative age-related macular degeneration, left eye, advanced atrophic without subfoveal involvement: Secondary | ICD-10-CM | POA: Diagnosis not present

## 2023-05-09 DIAGNOSIS — H353211 Exudative age-related macular degeneration, right eye, with active choroidal neovascularization: Secondary | ICD-10-CM | POA: Diagnosis not present

## 2023-05-25 NOTE — Progress Notes (Signed)
HPI: FU AS and palpitations; cardiac catheterization May 2012 showed normal LV function and no obstructive coronary disease; mild AS with AVA 1.2 cm2. Holter monitor July 2016 showed sinus rhythm with first-degree AV block, PACs, PVCs and brief PAT.  Most recent echocardiogram October 2023 showed normal LV function, mild left ventricular hypertrophy, grade 2 diastolic dysfunction, mild left atrial enlargement, mild to moderate mitral regurgitation, moderate tricuspid regurgitation, mild to moderate aortic insufficiency, moderate aortic stenosis with mean gradient 21 mmHg.  Since last seen she denies dyspnea on exertion, orthopnea, pedal edema, chest pain or syncope.  Current Outpatient Medications  Medication Sig Dispense Refill   acetaminophen (TYLENOL) 325 MG tablet Tylenol 325 mg tablet  Take 2 tablets every 6 hours by oral route.     amLODipine (NORVASC) 10 MG tablet TAKE ONE TABLET BY MOUTH ONCE DAILY 90 tablet 3   atorvastatin (LIPITOR) 20 MG tablet Take 20 mg by mouth daily.     calcium-vitamin D (OSCAL WITH D) 500-200 MG-UNIT per tablet Take 1 tablet by mouth 2 (two) times daily.     hydrochlorothiazide (HYDRODIURIL) 25 MG tablet Take 25 mg by mouth daily.     lisinopril (PRINIVIL,ZESTRIL) 10 MG tablet Take 10 mg by mouth daily.     metoprolol succinate (TOPROL-XL) 25 MG 24 hr tablet TAKE ONE TABLET BY MOUTH DAILY 90 tablet 3   Multiple Vitamins-Minerals (CENTRUM SILVER 50+WOMEN PO) Take by mouth daily.     Oyster Shell Calcium 500 MG TABS      zolpidem (AMBIEN) 10 MG tablet Take 10 mg by mouth at bedtime as needed for sleep.      acetaminophen (TYLENOL) 650 MG CR tablet Take 650 mg by mouth every 8 (eight) hours as needed for pain.     busPIRone (BUSPAR) 5 MG tablet Take 1 tablet by mouth 2 (two) times daily. (Patient not taking: Reported on 04/16/2022)     cholecalciferol (VITAMIN D) 1000 UNITS tablet Take 1,000 Units by mouth daily.     MULTIPLE VITAMIN PO one tablet     Multiple  Vitamins-Minerals (ICAPS PO) Take 1 tablet by mouth 2 (two) times daily. (Patient not taking: Reported on 04/16/2022)     No current facility-administered medications for this visit.     Past Medical History:  Diagnosis Date   Aortic stenosis    Essential hypertension 05/30/2015   Hyperlipidemia    Hypertension    Irregular heartbeat 06/02/2015   Macular degeneration of both eyes    DRY, RIGHT EYE WORSE THAN LEFT   Palpitations 05/30/2015   PSVT (paroxysmal supraventricular tachycardia) 07/23/2015   PVC's (premature ventricular contractions) 07/23/2015    Past Surgical History:  Procedure Laterality Date   CHOLECYSTECTOMY  10 YRS AGO   COLONOSCOPY WITH PROPOFOL N/A 05/07/2014   Procedure: COLONOSCOPY WITH PROPOFOL;  Surgeon: Charolett Bumpers, MD;  Location: [facility];  Service: Endoscopy;  Laterality: N/A;   EYE SURGERY Bilateral LAST FEW YEARS   LENS REPLACEMENT  FOR CATARACTS   LAPAROSCOPY  YRS AGO   TONSILLECTOMY  AS CHILD    Social History   Socioeconomic History   Marital status: Married    Spouse name: Not on file   Number of children: 1   Years of education: Not on file   Highest education level: Not on file  Occupational History   Not on file  Tobacco Use   Smoking status: Former    Current packs/day: 0.00    Types: Cigarettes  Quit date: 11/08/1992    Years since quitting: 30.5   Smokeless tobacco: Never  Vaping Use   Vaping status: Never Used  Substance and Sexual Activity   Alcohol use: Yes    Alcohol/week: 0.0 standard drinks of alcohol    Comment: RARE   Drug use: No   Sexual activity: Not on file  Other Topics Concern   Not on file  Social History Narrative   Not on file   Social Determinants of Health   Financial Resource Strain: Not on file  Food Insecurity: No Food Insecurity (07/21/2022)   Hunger Vital Sign    Worried About Running Out of Food in the Last Year: Never true    Ran Out of Food in the Last Year: Never true  Transportation  Needs: No Transportation Needs (07/21/2022)   PRAPARE - Administrator, Civil Service (Medical): No    Lack of Transportation (Non-Medical): No  Physical Activity: Not on file  Stress: Not on file  Social Connections: Not on file  Intimate Partner Violence: Not on file    Family History  Problem Relation Age of Onset   Cancer Mother    CAD Mother        CABG 45s   COPD Father    Breast cancer Neg Hx     ROS: no fevers or chills, productive cough, hemoptysis, dysphasia, odynophagia, melena, hematochezia, dysuria, hematuria, rash, seizure activity, orthopnea, PND, pedal edema, claudication. Remaining systems are negative.  Physical Exam: Well-developed well-nourished in no acute distress.  Skin is warm and dry.  HEENT is normal.  Neck is supple.  Chest is clear to auscultation with normal expansion.  Cardiovascular exam is regular rate and rhythm.  2/6 systolic murmur left sternal border.  Radiates to the carotids. Abdominal exam nontender or distended. No masses palpated. Extremities show no edema. neuro grossly intact  EKG Interpretation Date/Time:  Thursday June 02 2023 08:49:26 EDT Ventricular Rate:  69 PR Interval:  204 QRS Duration:  104 QT Interval:  384 QTC Calculation: 411 R Axis:   45  Text Interpretation: Normal sinus rhythm Sinus rhythm with first degree AV block When compared with ECG of 12-Mar-2011 04:59, No significant change was found Confirmed by Olga Millers ([id number]) on 06/02/2023 8:59:06 AM    A/P  1 aortic stenosis/aortic insufficiency-patient will need follow-up echocardiogram October 2024.  She understands she will likely require valve replacement in the future.  I have explained the symptoms to be aware of including chest pain, worsening dyspnea, presyncope/syncope.  2 hypertension-patient's blood pressure is controlled.  Continue present medications.  3 hyperlipidemia-continue statin.  Lipids and liver monitored by primary care.  4  palpitations-continue beta-blocker at present dose.  Olga Millers, MD

## 2023-06-02 ENCOUNTER — Encounter: Payer: Self-pay | Admitting: Cardiology

## 2023-06-02 ENCOUNTER — Ambulatory Visit: Payer: PPO | Attending: Cardiology | Admitting: Cardiology

## 2023-06-02 VITALS — BP 138/62 | HR 69 | Ht 65.0 in | Wt 156.0 lb

## 2023-06-02 DIAGNOSIS — R002 Palpitations: Secondary | ICD-10-CM

## 2023-06-02 DIAGNOSIS — I350 Nonrheumatic aortic (valve) stenosis: Secondary | ICD-10-CM

## 2023-06-02 DIAGNOSIS — I10 Essential (primary) hypertension: Secondary | ICD-10-CM

## 2023-06-02 DIAGNOSIS — E7800 Pure hypercholesterolemia, unspecified: Secondary | ICD-10-CM | POA: Diagnosis not present

## 2023-06-02 NOTE — Patient Instructions (Signed)
   Testing/Procedures:  Your physician has requested that you have an echocardiogram. Echocardiography is a painless test that uses sound waves to create images of your heart. It provides your doctor with information about the size and shape of your heart and how well your heart's chambers and valves are working. This procedure takes approximately one hour. There are no restrictions for this procedure. Please do NOT wear cologne, perfume, aftershave, or lotions (deodorant is allowed). Please arrive 15 minutes prior to your appointment time. [address]-SCHEDULE IN OCTOBER   Follow-Up: At [facility], you and your health needs are our priority.  As part of our continuing mission to provide you with exceptional heart care, we have created designated Provider Care Teams.  These Care Teams include your primary Cardiologist (physician) and Advanced Practice Providers (APPs -  Physician Assistants and Nurse Practitioners) who all work together to provide you with the care you need, when you need it.  We recommend signing up for the patient portal called "MyChart".  Sign up information is provided on this After Visit Summary.  MyChart is used to connect with patients for Virtual Visits (Telemedicine).  Patients are able to view lab/test results, encounter notes, upcoming appointments, etc.  Non-urgent messages can be sent to your provider as well.   To learn more about what you can do with MyChart, go to [email or web address].    Your next appointment:   6 month(s)  Provider:   Olga Millers, MD

## 2023-07-04 DIAGNOSIS — H353123 Nonexudative age-related macular degeneration, left eye, advanced atrophic without subfoveal involvement: Secondary | ICD-10-CM | POA: Diagnosis not present

## 2023-07-04 DIAGNOSIS — H43813 Vitreous degeneration, bilateral: Secondary | ICD-10-CM | POA: Diagnosis not present

## 2023-07-04 DIAGNOSIS — H35033 Hypertensive retinopathy, bilateral: Secondary | ICD-10-CM | POA: Diagnosis not present

## 2023-07-04 DIAGNOSIS — H353211 Exudative age-related macular degeneration, right eye, with active choroidal neovascularization: Secondary | ICD-10-CM | POA: Diagnosis not present

## 2023-08-10 ENCOUNTER — Other Ambulatory Visit: Payer: Self-pay | Admitting: Obstetrics and Gynecology

## 2023-08-10 DIAGNOSIS — Z1231 Encounter for screening mammogram for malignant neoplasm of breast: Secondary | ICD-10-CM

## 2023-08-17 ENCOUNTER — Ambulatory Visit (HOSPITAL_COMMUNITY): Payer: PPO | Attending: Cardiology

## 2023-08-17 DIAGNOSIS — I350 Nonrheumatic aortic (valve) stenosis: Secondary | ICD-10-CM | POA: Insufficient documentation

## 2023-08-17 LAB — ECHOCARDIOGRAM COMPLETE
AR max vel: 0.98 cm2
AV Area VTI: 0.99 cm2
AV Area mean vel: 0.96 cm2
AV Mean grad: 21.5 mm[Hg]
AV Peak grad: 40.3 mm[Hg]
Ao pk vel: 3.18 m/s
Area-P 1/2: 3.77 cm2
P 1/2 time: 336 ms
S' Lateral: 2.3 cm

## 2023-08-26 DIAGNOSIS — I471 Supraventricular tachycardia, unspecified: Secondary | ICD-10-CM | POA: Diagnosis not present

## 2023-08-26 DIAGNOSIS — I350 Nonrheumatic aortic (valve) stenosis: Secondary | ICD-10-CM | POA: Diagnosis not present

## 2023-08-26 DIAGNOSIS — R7303 Prediabetes: Secondary | ICD-10-CM | POA: Diagnosis not present

## 2023-08-26 DIAGNOSIS — H353211 Exudative age-related macular degeneration, right eye, with active choroidal neovascularization: Secondary | ICD-10-CM | POA: Diagnosis not present

## 2023-08-26 DIAGNOSIS — E7800 Pure hypercholesterolemia, unspecified: Secondary | ICD-10-CM | POA: Diagnosis not present

## 2023-08-26 DIAGNOSIS — I10 Essential (primary) hypertension: Secondary | ICD-10-CM | POA: Diagnosis not present

## 2023-08-26 DIAGNOSIS — Z1389 Encounter for screening for other disorder: Secondary | ICD-10-CM | POA: Diagnosis not present

## 2023-08-26 DIAGNOSIS — Z0000 Encounter for general adult medical examination without abnormal findings: Secondary | ICD-10-CM | POA: Diagnosis not present

## 2023-08-26 DIAGNOSIS — G4700 Insomnia, unspecified: Secondary | ICD-10-CM | POA: Diagnosis not present

## 2023-09-01 DIAGNOSIS — H353211 Exudative age-related macular degeneration, right eye, with active choroidal neovascularization: Secondary | ICD-10-CM | POA: Diagnosis not present

## 2023-09-02 ENCOUNTER — Other Ambulatory Visit (HOSPITAL_COMMUNITY): Payer: PPO

## 2023-09-13 ENCOUNTER — Ambulatory Visit
Admission: RE | Admit: 2023-09-13 | Discharge: 2023-09-13 | Disposition: A | Discharge status: Home / Self Care | Payer: PPO | Source: Ambulatory Visit | Attending: Obstetrics and Gynecology | Admitting: Obstetrics and Gynecology

## 2023-09-13 DIAGNOSIS — Z1231 Encounter for screening mammogram for malignant neoplasm of breast: Secondary | ICD-10-CM | POA: Diagnosis not present

## 2023-09-20 DIAGNOSIS — Z6826 Body mass index (BMI) 26.0-26.9, adult: Secondary | ICD-10-CM | POA: Diagnosis not present

## 2023-09-20 DIAGNOSIS — Z01419 Encounter for gynecological examination (general) (routine) without abnormal findings: Secondary | ICD-10-CM | POA: Diagnosis not present

## 2023-09-20 DIAGNOSIS — N959 Unspecified menopausal and perimenopausal disorder: Secondary | ICD-10-CM | POA: Diagnosis not present

## 2023-09-29 DIAGNOSIS — D1801 Hemangioma of skin and subcutaneous tissue: Secondary | ICD-10-CM | POA: Diagnosis not present

## 2023-09-29 DIAGNOSIS — L570 Actinic keratosis: Secondary | ICD-10-CM | POA: Diagnosis not present

## 2023-09-29 DIAGNOSIS — L821 Other seborrheic keratosis: Secondary | ICD-10-CM | POA: Diagnosis not present

## 2023-09-29 DIAGNOSIS — L565 Disseminated superficial actinic porokeratosis (DSAP): Secondary | ICD-10-CM | POA: Diagnosis not present

## 2023-10-27 DIAGNOSIS — H43813 Vitreous degeneration, bilateral: Secondary | ICD-10-CM | POA: Diagnosis not present

## 2023-10-27 DIAGNOSIS — H35033 Hypertensive retinopathy, bilateral: Secondary | ICD-10-CM | POA: Diagnosis not present

## 2023-10-27 DIAGNOSIS — H353123 Nonexudative age-related macular degeneration, left eye, advanced atrophic without subfoveal involvement: Secondary | ICD-10-CM | POA: Diagnosis not present

## 2023-10-27 DIAGNOSIS — H353211 Exudative age-related macular degeneration, right eye, with active choroidal neovascularization: Secondary | ICD-10-CM | POA: Diagnosis not present

## 2024-02-09 DIAGNOSIS — H43813 Vitreous degeneration, bilateral: Secondary | ICD-10-CM | POA: Diagnosis not present

## 2024-02-09 DIAGNOSIS — H353211 Exudative age-related macular degeneration, right eye, with active choroidal neovascularization: Secondary | ICD-10-CM | POA: Diagnosis not present

## 2024-02-09 DIAGNOSIS — H353124 Nonexudative age-related macular degeneration, left eye, advanced atrophic with subfoveal involvement: Secondary | ICD-10-CM | POA: Diagnosis not present

## 2024-02-09 DIAGNOSIS — H35033 Hypertensive retinopathy, bilateral: Secondary | ICD-10-CM | POA: Diagnosis not present

## 2024-02-23 DIAGNOSIS — H353124 Nonexudative age-related macular degeneration, left eye, advanced atrophic with subfoveal involvement: Secondary | ICD-10-CM | POA: Diagnosis not present

## 2024-03-14 ENCOUNTER — Telehealth: Payer: Self-pay | Admitting: Cardiology

## 2024-03-14 NOTE — Telephone Encounter (Signed)
 Called and spoke to pt. She has recently had 2 CP episodes. Rescheduled July 2025 appt with Dr. Audery Blazing for 03/29/24. Was due for 6 mo f/u in Jan 2025. Pt very appreciative. ED Precautions reviewed with pt who verbalized understanding.

## 2024-03-14 NOTE — Telephone Encounter (Signed)
 Pt c/o of Chest Pain: STAT if active CP, including tightness, pressure, jaw pain, radiating pain to shoulder/upper arm/back, CP unrelieved by Nitro. Symptoms reported of SOB, nausea, vomiting, sweating.  1. Are you having CP right now? NO, I have no pain now.   2. Are you experiencing any other symptoms (ex. SOB, nausea, vomiting, sweating)? N/A   3. Is your CP continuous or coming and going? Coming and going   4. Have you taken Nitroglycerin? I have taken no nitroglycerin.   5. How long have you been experiencing CP? It has only occurred twice for a few seconds in the last several weeks.     6. If NO CP at time of call then end call with telling Pt to call back or call 911 if Chest pain returns prior to return call from triage team.   I just wanted to let Dr. Audery Blazing know about the incidents since he always said let me know if you experience any pain. Since I haven't had pain previously, I thought almost 3 months was too long to wait for an appointment. I would appreciate your asking Dr. Audery Blazing if that is acceptable. Thanks, [name]   Answers received via patient schedule. Please advise.

## 2024-03-14 NOTE — Telephone Encounter (Signed)
 Spoke to pt's husband, who states pt is at a hair appt; please call back at 4:30pm.

## 2024-03-15 NOTE — Progress Notes (Signed)
 HPI: FU AS and palpitations; cardiac catheterization May 2012 showed normal LV function and no obstructive coronary disease; mild AS with AVA 1.2 cm2. Holter monitor July 2016 showed sinus rhythm with first-degree AV block, PACs, PVCs and brief PAT.  Follow-up echocardiogram October 2024 showed normal LV function, mild left ventricular hypertrophy, mild mitral regurgitation, mild to moderate aortic insufficiency, moderate aortic stenosis with mean gradient 21.5 mmHg and aortic valve area 0.99 cm.  Patient contacted the office recently with complaints of chest pain and added to my schedule today.  Since last seen patient states over the past month she has had 3 episodes of chest pain in the left chest area lasting 2 to 3 seconds each time.  No associated symptoms and not related to activities.  She denies increased dyspnea or syncope.  Current Outpatient Medications  Medication Sig Dispense Refill   acetaminophen (TYLENOL) 325 MG tablet Tylenol 325 mg tablet  Take 2 tablets every 6 hours by oral route.     acetaminophen (TYLENOL) 650 MG CR tablet Take 650 mg by mouth every 8 (eight) hours as needed for pain.     amLODipine  (NORVASC ) 10 MG tablet TAKE ONE TABLET BY MOUTH ONCE DAILY 90 tablet 3   atorvastatin (LIPITOR) 20 MG tablet Take 20 mg by mouth daily.     busPIRone (BUSPAR) 5 MG tablet Take 1 tablet by mouth 2 (two) times daily. (Patient not taking: Reported on 04/16/2022)     calcium-vitamin D (OSCAL WITH D) 500-200 MG-UNIT per tablet Take 1 tablet by mouth 2 (two) times daily.     cholecalciferol (VITAMIN D) 1000 UNITS tablet Take 1,000 Units by mouth daily.     hydrochlorothiazide (HYDRODIURIL) 25 MG tablet Take 25 mg by mouth daily.     lisinopril (PRINIVIL,ZESTRIL) 10 MG tablet Take 10 mg by mouth daily.     metoprolol  succinate (TOPROL -XL) 25 MG 24 hr tablet TAKE ONE TABLET BY MOUTH DAILY 90 tablet 3   MULTIPLE VITAMIN PO one tablet     Multiple Vitamins-Minerals (CENTRUM SILVER  50+WOMEN PO) Take by mouth daily.     Multiple Vitamins-Minerals (ICAPS PO) Take 1 tablet by mouth 2 (two) times daily. (Patient not taking: Reported on 04/16/2022)     Oyster Shell Calcium 500 MG TABS      zolpidem (AMBIEN) 10 MG tablet Take 10 mg by mouth at bedtime as needed for sleep.      No current facility-administered medications for this visit.     Past Medical History:  Diagnosis Date   Aortic stenosis    Essential hypertension 05/30/2015   Hyperlipidemia    Hypertension    Irregular heartbeat 06/02/2015   Macular degeneration of both eyes    DRY, RIGHT EYE WORSE THAN LEFT   Palpitations 05/30/2015   PSVT (paroxysmal supraventricular tachycardia) (HCC) 07/23/2015   PVC's (premature ventricular contractions) 07/23/2015    Past Surgical History:  Procedure Laterality Date   CHOLECYSTECTOMY  10 YRS AGO   COLONOSCOPY WITH PROPOFOL  N/A 05/07/2014   Procedure: COLONOSCOPY WITH PROPOFOL ;  Surgeon: Garrett Kallman, MD;  Location: [facility];  Service: Endoscopy;  Laterality: N/A;   EYE SURGERY Bilateral LAST FEW YEARS   LENS REPLACEMENT  FOR CATARACTS   LAPAROSCOPY  YRS AGO   TONSILLECTOMY  AS CHILD    Social History   Socioeconomic History   Marital status: Married    Spouse name: Not on file   Number of children: 1   Years of  education: Not on file   Highest education level: Not on file  Occupational History   Not on file  Tobacco Use   Smoking status: Former    Current packs/day: 0.00    Types: Cigarettes    Quit date: 11/08/1992    Years since quitting: 31.3   Smokeless tobacco: Never  Vaping Use   Vaping status: Never Used  Substance and Sexual Activity   Alcohol use: Yes    Alcohol/week: 0.0 standard drinks of alcohol    Comment: RARE   Drug use: No   Sexual activity: Not on file  Other Topics Concern   Not on file  Social History Narrative   Not on file   Social Drivers of Health   Financial Resource Strain: Not on file  Food Insecurity: No Food  Insecurity (07/21/2022)   Hunger Vital Sign    Worried About Running Out of Food in the Last Year: Never true    Ran Out of Food in the Last Year: Never true  Transportation Needs: No Transportation Needs (07/21/2022)   PRAPARE - Administrator, Civil Service (Medical): No    Lack of Transportation (Non-Medical): No  Physical Activity: Not on file  Stress: Not on file  Social Connections: Not on file  Intimate Partner Violence: Not on file    Family History  Problem Relation Age of Onset   Cancer Mother    CAD Mother        CABG 90s   COPD Father    Breast cancer Neg Hx     ROS: no fevers or chills, productive cough, hemoptysis, dysphasia, odynophagia, melena, hematochezia, dysuria, hematuria, rash, seizure activity, orthopnea, PND, pedal edema, claudication. Remaining systems are negative.  Physical Exam: Well-developed well-nourished in no acute distress.  Skin is warm and dry.  HEENT is normal.  Neck is supple.  Chest is clear to auscultation with normal expansion.  Cardiovascular exam is regular rate and rhythm.  3/6 systolic murmur left sternal border.  S2 is not diminished. Abdominal exam nontender or distended. No masses palpated. Extremities show no edema. neuro grossly intact  EKG Interpretation Date/Time:  Thursday Mar 29 2024 14:55:32 EDT Ventricular Rate:  73 PR Interval:  178 QRS Duration:  98 QT Interval:  382 QTC Calculation: 420 R Axis:   37  Text Interpretation: Normal sinus rhythm Normal ECG Confirmed by Alexandria Angel ([id number]) on 03/29/2024 3:03:41 PM    A/P  1 chest pain-symptoms extremely atypical and not likely cardiac related.  Electrocardiogram shows no ST changes.  Will not pursue further ischemia evaluation.  I do not think this is likely secondary to her aortic stenosis.  2 aortic stenosis/aortic insufficiency-plan follow-up echocardiogram October 2025.  Patient understands she will likely require TAVR in the future.  3  hypertension-blood pressure mildly elevated; however typically controlled.  Will continue present medications and follow.  4 hyperlipidemia-continue statin.  5 history of palpitations-will continue beta-blocker.  Alexandria Angel, MD

## 2024-03-27 DIAGNOSIS — H5213 Myopia, bilateral: Secondary | ICD-10-CM | POA: Diagnosis not present

## 2024-03-27 DIAGNOSIS — Z961 Presence of intraocular lens: Secondary | ICD-10-CM | POA: Diagnosis not present

## 2024-03-29 ENCOUNTER — Encounter: Payer: Self-pay | Admitting: Cardiology

## 2024-03-29 ENCOUNTER — Ambulatory Visit: Attending: Cardiology | Admitting: Cardiology

## 2024-03-29 VITALS — BP 156/56 | HR 73 | Ht 65.0 in | Wt 160.0 lb

## 2024-03-29 DIAGNOSIS — I350 Nonrheumatic aortic (valve) stenosis: Secondary | ICD-10-CM

## 2024-03-29 DIAGNOSIS — R072 Precordial pain: Secondary | ICD-10-CM

## 2024-03-29 DIAGNOSIS — I10 Essential (primary) hypertension: Secondary | ICD-10-CM | POA: Diagnosis not present

## 2024-03-29 NOTE — Patient Instructions (Signed)
  Testing/Procedures:  Your physician has requested that you have an echocardiogram. Echocardiography is a painless test that uses sound waves to create images of your heart. It provides your doctor with information about the size and shape of your heart and how well your heart's chambers and valves are working. This procedure takes approximately one hour. There are no restrictions for this procedure. Please do NOT wear cologne, perfume, aftershave, or lotions (deodorant is allowed). Please arrive 15 minutes prior to your appointment time.  Please note: We ask at that you not bring children with you during ultrasound (echo/ vascular) testing. Due to room size and safety concerns, children are not allowed in the ultrasound rooms during exams. Our front office staff cannot provide observation of children in our lobby area while testing is being conducted. An adult accompanying a patient to their appointment will only be allowed in the ultrasound room at the discretion of the ultrasound technician under special circumstances. We apologize for any inconvenience. [address]-2ND FLOOR-SCHEDULE IN OCTOBER  Follow-Up: At [facility], you and your health needs are our priority.  As part of our continuing mission to provide you with exceptional heart care, our providers are all part of one team.  This team includes your primary Cardiologist (physician) and Advanced Practice Providers or APPs (Physician Assistants and Nurse Practitioners) who all work together to provide you with the care you need, when you need it.  Your next appointment:    OCTOBER WITH DR Audery Blazing AFTER ECHO COMPLETED

## 2024-04-05 DIAGNOSIS — H353211 Exudative age-related macular degeneration, right eye, with active choroidal neovascularization: Secondary | ICD-10-CM | POA: Diagnosis not present

## 2024-04-05 DIAGNOSIS — H35033 Hypertensive retinopathy, bilateral: Secondary | ICD-10-CM | POA: Diagnosis not present

## 2024-04-05 DIAGNOSIS — H43813 Vitreous degeneration, bilateral: Secondary | ICD-10-CM | POA: Diagnosis not present

## 2024-04-05 DIAGNOSIS — H353124 Nonexudative age-related macular degeneration, left eye, advanced atrophic with subfoveal involvement: Secondary | ICD-10-CM | POA: Diagnosis not present

## 2024-04-07 DIAGNOSIS — I10 Essential (primary) hypertension: Secondary | ICD-10-CM | POA: Diagnosis not present

## 2024-04-07 DIAGNOSIS — E7800 Pure hypercholesterolemia, unspecified: Secondary | ICD-10-CM | POA: Diagnosis not present

## 2024-04-07 DIAGNOSIS — I350 Nonrheumatic aortic (valve) stenosis: Secondary | ICD-10-CM | POA: Diagnosis not present

## 2024-04-07 DIAGNOSIS — F411 Generalized anxiety disorder: Secondary | ICD-10-CM | POA: Diagnosis not present

## 2024-05-29 ENCOUNTER — Ambulatory Visit: Admitting: Cardiology

## 2024-05-31 DIAGNOSIS — H353211 Exudative age-related macular degeneration, right eye, with active choroidal neovascularization: Secondary | ICD-10-CM | POA: Diagnosis not present

## 2024-05-31 DIAGNOSIS — H353124 Nonexudative age-related macular degeneration, left eye, advanced atrophic with subfoveal involvement: Secondary | ICD-10-CM | POA: Diagnosis not present

## 2024-07-17 ENCOUNTER — Other Ambulatory Visit: Payer: Self-pay

## 2024-07-17 ENCOUNTER — Emergency Department (HOSPITAL_COMMUNITY): Admission: EM | Admit: 2024-07-17 | Discharge: 2024-07-17 | Disposition: A | Discharge status: Home / Self Care

## 2024-07-17 ENCOUNTER — Encounter (HOSPITAL_COMMUNITY): Payer: Self-pay

## 2024-07-17 ENCOUNTER — Emergency Department (HOSPITAL_COMMUNITY)

## 2024-07-17 DIAGNOSIS — W19XXXA Unspecified fall, initial encounter: Secondary | ICD-10-CM | POA: Insufficient documentation

## 2024-07-17 DIAGNOSIS — M47812 Spondylosis without myelopathy or radiculopathy, cervical region: Secondary | ICD-10-CM

## 2024-07-17 DIAGNOSIS — M549 Dorsalgia, unspecified: Secondary | ICD-10-CM | POA: Diagnosis present

## 2024-07-17 DIAGNOSIS — Y9301 Activity, walking, marching and hiking: Secondary | ICD-10-CM | POA: Insufficient documentation

## 2024-07-17 DIAGNOSIS — E785 Hyperlipidemia, unspecified: Secondary | ICD-10-CM | POA: Diagnosis present

## 2024-07-17 DIAGNOSIS — E876 Hypokalemia: Secondary | ICD-10-CM | POA: Diagnosis present

## 2024-07-17 DIAGNOSIS — M545 Low back pain, unspecified: Secondary | ICD-10-CM | POA: Diagnosis not present

## 2024-07-17 DIAGNOSIS — Z79899 Other long term (current) drug therapy: Secondary | ICD-10-CM | POA: Diagnosis not present

## 2024-07-17 DIAGNOSIS — R2689 Other abnormalities of gait and mobility: Secondary | ICD-10-CM | POA: Insufficient documentation

## 2024-07-17 DIAGNOSIS — M50322 Other cervical disc degeneration at C5-C6 level: Secondary | ICD-10-CM

## 2024-07-17 DIAGNOSIS — M50323 Other cervical disc degeneration at C6-C7 level: Secondary | ICD-10-CM

## 2024-07-17 DIAGNOSIS — M5440 Lumbago with sciatica, unspecified side: Secondary | ICD-10-CM | POA: Insufficient documentation

## 2024-07-17 DIAGNOSIS — I471 Supraventricular tachycardia, unspecified: Secondary | ICD-10-CM | POA: Diagnosis present

## 2024-07-17 DIAGNOSIS — S0990XA Unspecified injury of head, initial encounter: Secondary | ICD-10-CM | POA: Diagnosis not present

## 2024-07-17 DIAGNOSIS — I10 Essential (primary) hypertension: Secondary | ICD-10-CM | POA: Diagnosis present

## 2024-07-17 LAB — CBC WITH DIFFERENTIAL/PLATELET
Abs Immature Granulocytes: 0.04 K/uL (ref 0.00–0.07)
Basophils Absolute: 0 K/uL (ref 0.0–0.1)
Basophils Relative: 0 %
Eosinophils Absolute: 0 K/uL (ref 0.0–0.5)
Eosinophils Relative: 0 %
HCT: 40 % (ref 36.0–46.0)
Hemoglobin: 12.9 g/dL (ref 12.0–15.0)
Immature Granulocytes: 0 %
Lymphocytes Relative: 11 %
Lymphs Abs: 1.1 K/uL (ref 0.7–4.0)
MCH: 30.5 pg (ref 26.0–34.0)
MCHC: 32.3 g/dL (ref 30.0–36.0)
MCV: 94.6 fL (ref 80.0–100.0)
Monocytes Absolute: 0.7 K/uL (ref 0.1–1.0)
Monocytes Relative: 7 %
Neutro Abs: 8.5 K/uL — ABNORMAL HIGH (ref 1.7–7.7)
Neutrophils Relative %: 82 %
Platelets: 270 K/uL (ref 150–400)
RBC: 4.23 MIL/uL (ref 3.87–5.11)
RDW: 13.2 % (ref 11.5–15.5)
WBC: 10.4 K/uL (ref 4.0–10.5)
nRBC: 0 % (ref 0.0–0.2)

## 2024-07-17 LAB — COMPREHENSIVE METABOLIC PANEL WITH GFR
ALT: 20 U/L (ref 0–44)
AST: 29 U/L (ref 15–41)
Albumin: 4.5 g/dL (ref 3.5–5.0)
Alkaline Phosphatase: 123 U/L (ref 38–126)
Anion gap: 14 (ref 5–15)
BUN: 11 mg/dL (ref 8–23)
CO2: 25 mmol/L (ref 22–32)
Calcium: 9.8 mg/dL (ref 8.9–10.3)
Chloride: 102 mmol/L (ref 98–111)
Creatinine, Ser: 0.63 mg/dL (ref 0.44–1.00)
GFR, Estimated: >60 mL/min (ref >60)
Glucose, Bld: 123 mg/dL — ABNORMAL HIGH (ref 70–99)
Potassium: 3.2 mmol/L — ABNORMAL LOW (ref 3.5–5.1)
Sodium: 140 mmol/L (ref 135–145)
Total Bilirubin: 0.8 mg/dL (ref 0.0–1.2)
Total Protein: 7.5 g/dL (ref 6.5–8.1)

## 2024-07-17 MED ORDER — POTASSIUM CHLORIDE CRYS ER 20 MEQ PO TBCR
40.0000 meq | EXTENDED_RELEASE_TABLET | Freq: Once | ORAL | Status: AC
  Administered 2024-07-17: 40 meq via ORAL
  Filled 2024-07-17: qty 2

## 2024-07-17 MED ORDER — OXYCODONE HCL 5 MG PO TABS: 2.5000 mg | ORAL_TABLET | ORAL | 0 refills | Status: AC | PRN

## 2024-07-17 MED ORDER — OXYCODONE HCL 5 MG PO TABS: 5.0000 mg | ORAL_TABLET | ORAL | Status: DC | PRN

## 2024-07-17 MED ORDER — ONDANSETRON HCL 4 MG/2ML IJ SOLN: 4.0000 mg | Freq: Four times a day (QID) | INTRAMUSCULAR | Status: DC | PRN

## 2024-07-17 MED ORDER — ONDANSETRON HCL 4 MG PO TABS: 4.0000 mg | ORAL_TABLET | Freq: Four times a day (QID) | ORAL | Status: DC | PRN

## 2024-07-17 MED ORDER — ONDANSETRON HCL 4 MG PO TABS
4.0000 mg | ORAL_TABLET | Freq: Once | ORAL | Status: AC
  Administered 2024-07-17: 4 mg via ORAL
  Filled 2024-07-17: qty 1

## 2024-07-17 MED ORDER — ENOXAPARIN SODIUM 40 MG/0.4ML IJ SOSY: 40.0000 mg | PREFILLED_SYRINGE | INTRAMUSCULAR | Status: DC

## 2024-07-17 MED ORDER — OXYCODONE-ACETAMINOPHEN 5-325 MG PO TABS
1.0000 | ORAL_TABLET | Freq: Once | ORAL | Status: AC
  Administered 2024-07-17: 1 via ORAL
  Filled 2024-07-17: qty 1

## 2024-07-17 MED ORDER — ACETAMINOPHEN 650 MG RE SUPP: 650.0000 mg | Freq: Four times a day (QID) | RECTAL | Status: DC | PRN

## 2024-07-17 MED ORDER — ACETAMINOPHEN 325 MG PO TABS: 650.0000 mg | ORAL_TABLET | Freq: Four times a day (QID) | ORAL | Status: DC | PRN

## 2024-07-17 NOTE — ED Triage Notes (Signed)
 [facility] reports pt slid out of bed this morning due to back pain. Pt states she started to have right lower back pain last. Pt did have a fall on Sunday morning but did not get evaluated for that fall.

## 2024-07-17 NOTE — Progress Notes (Signed)
   Patient: [name] MRN: [id number]   DOA: 07/17/2024 After evaluation by physical therapy, the patient will be going home.  Observation process will be canceled.  She will be discharged from the emergency department.  Admission orders were discontinued and patient placement updated.  Author: Alm Dorn Castor, MD 07/17/2024

## 2024-07-17 NOTE — Evaluation (Signed)
 Physical Therapy Evaluation Patient Details Name: [name] MRN: [id number] DOB: April 19, 1941 Today's Date: 07/17/2024  History of Present Illness  Pt is 83 yo female presented on 07/17/24 with intractable back pain after initial fall on 07/15/24. Imaging negative for acute injury.  Pt with hx including but not limited to aortic stenosis, HTN, macular degeneration  Clinical Impression  Pt admitted with above diagnosis. At baseline, pt very active and independent. She lives with spouse at ILF at [facility].  Her daughter reports planning to stay a couple days with her at d/c if needed.  Today, pt very apprehensive about movement due to fear of pain; however, once she began to transfer reports significant improvement in pain.  She did have pain meds prior to session and patch on low back. Pt was able to transfer and ambulate 200' with supervision - she was very pleased with her pain control. Educated pt and family on transfer techniques and resting positions for pain control. Do recommend RW as some pain likely to return as pain meds decreased and potentially could benefit from HHPT eval at her facility to progress/educate.  Pt currently with functional limitations due to the deficits listed below (see PT Problem List). Pt will benefit from acute skilled PT to increase their independence and safety with mobility to allow discharge.           If plan is discharge home, recommend the following: Assistance with cooking/housework;Help with stairs or ramp for entrance   Can travel by private vehicle        Equipment Recommendations Rolling walker (2 wheels)  Recommendations for Other Services       Functional Status Assessment Patient has had a recent decline in their functional status and demonstrates the ability to make significant improvements in function in a reasonable and predictable amount of time.     Precautions / Restrictions Precautions Precautions: Fall      Mobility  Bed  Mobility Overal bed mobility: Needs Assistance Bed Mobility: Rolling, Sidelying to Sit, Sit to Sidelying Rolling: Supervision Sidelying to sit: Supervision     Sit to sidelying: Supervision General bed mobility comments: Cues for log roll for pain control; increased time but no assist needed    Transfers Overall transfer level: Needs assistance Equipment used: Rolling walker (2 wheels), None Transfers: Sit to/from Stand Sit to Stand: Supervision           General transfer comment: Stood from stretcher and toilet safely.  Able to perform ADLs.    Ambulation/Gait Ambulation/Gait assistance: Supervision, Contact guard assist Gait Distance (Feet): 200 Feet Assistive device: Rolling walker (2 wheels), None Gait Pattern/deviations: Step-through pattern Gait velocity: decreased but functional     General Gait Details: Started wtih CGA and RW progressed to supervision and no AD.  Steady gait. Tolerated well.  Stairs            Wheelchair Mobility     Tilt Bed    Modified Rankin (Stroke Patients Only)       Balance Overall balance assessment: Needs assistance Sitting-balance support: No upper extremity supported Sitting balance-Leahy Scale: Good     Standing balance support: No upper extremity supported Standing balance-Leahy Scale: Good                               Pertinent Vitals/Pain Pain Assessment Pain Assessment: No/denies pain    Home Living Family/patient expects to be discharged to:: Private  residence Living Arrangements: Spouse/significant other Available Help at Discharge: Family;Available 24 hours/day Type of Home: Independent living facility ([address] [facility]) Home Access: Level entry       Home Layout: One level Home Equipment: Shower seat;Grab bars - tub/shower;Rollator (4 wheels)      Prior Function Prior Level of Function : Independent/Modified Independent;Driving             Mobility Comments: Likes  to walk in park for exercise; does not use AD; active and independent; no falls other than one associated with this admission ADLs Comments: independent wtih adls and iadls     Extremity/Trunk Assessment   Upper Extremity Assessment Upper Extremity Assessment: Overall WFL for tasks assessed    Lower Extremity Assessment Lower Extremity Assessment: Overall WFL for tasks assessed (Did not MMT due to acuity of pain)    Cervical / Trunk Assessment Cervical / Trunk Assessment: Normal  Communication        Cognition Arousal: Alert Behavior During Therapy: WFL for tasks assessed/performed   PT - Cognitive impairments: No apparent impairments                       PT - Cognition Comments: Pt very pleased with her progress and pain control         Cueing       General Comments General comments (skin integrity, edema, etc.): Pt reports fell on Sunday morning.  It was middle of the night and she got up to urinate.  Walked out of her bedroom, through living area, into guest bed/bath and had sudden dizziness (unable to further describe other than happened quick).  States she fell and hit R side of head.  She was able to get up and carry on without issue until early Monday morning when low R back pain developed.  Denies any further dizziness - reports does take sleeping medication and felt that may contribute to dizziness. Pain not relieved with tylenol , advil, ice, or heat at home so presented to ED.  Pt did receive meds prior to therapy and with pain patch on R low back.    Exercises     Assessment/Plan    PT Assessment Patient needs continued PT services  PT Problem List Pain;Decreased activity tolerance;Decreased knowledge of use of DME;Decreased balance;Decreased mobility       PT Treatment Interventions DME instruction;Therapeutic exercise;Gait training;Balance training;Manual techniques;Modalities;Functional mobility training;Therapeutic activities;Patient/family  education    PT Goals (Current goals can be found in the Care Plan section)  Acute Rehab PT Goals Patient Stated Goal: return home PT Goal Formulation: With patient/family Time For Goal Achievement: 07/31/24 Potential to Achieve Goals: Good    Frequency Min 2X/week     Co-evaluation               AM-PAC PT 6 Clicks Mobility  Outcome Measure Help needed turning from your back to your side while in a flat bed without using bedrails?: A Little Help needed moving from lying on your back to sitting on the side of a flat bed without using bedrails?: A Little Help needed moving to and from a bed to a chair (including a wheelchair)?: A Little Help needed standing up from a chair using your arms (e.g., wheelchair or bedside chair)?: A Little Help needed to walk in hospital room?: A Little Help needed climbing 3-5 steps with a railing? : A Little 6 Click Score: 18    End of Session Equipment Utilized  During Treatment: Gait belt Activity Tolerance: Patient tolerated treatment well Patient left: in bed;with call bell/phone within reach;with family/visitor present Nurse Communication: Mobility status PT Visit Diagnosis: Other abnormalities of gait and mobility (R26.89);Pain Pain - Right/Left: Right Pain - part of body:  (low back)    Time: 8752-8672 PT Time Calculation (min) (ACUTE ONLY): 40 min   Charges:   PT Evaluation $PT Eval Low Complexity: 1 Low PT Treatments $Gait Training: 8-22 mins $Therapeutic Activity: 8-22 mins PT General Charges $$ ACUTE PT VISIT: 1 Visit         Benjiman, PT Acute Rehab Services [facility] [phone]   Benjiman VEAR Mulberry 07/17/2024, 2:09 PM

## 2024-07-17 NOTE — ED Provider Notes (Signed)
 [place] EMERGENCY DEPARTMENT AT [facility] Provider Note   CSN: [id number] Arrival date & time: 07/17/24  9148     Patient presents with: [name] is a 83 y.o. female.    Fall Pertinent negatives include no chest pain, no abdominal pain and no shortness of breath.    Patient presents with fall.  Patient states that she initially fell on Sunday.  States that she was walking subsequently fell.  States today, has been having right lower back pain.  Patient states that she took some Tylenol  yesterday as well as a couple doses of ibuprofen.  Patient states that she woke up this morning and could not get up out of bed because of the pain.  No bowel bladder incontinence.  No saddle anesthesia.  No leg numbness.  No weakness.  She does endorse this pain.  No chest pain or shortness of breath.  No nausea vomit diarrhea.  She may have hit her head on Sunday but she is not really sure.  Denies anticoagulation use.  No neck pain.  Otherwise, denies all complaints.     Prior to Admission medications   Medication Sig Start Date End Date Taking? Authorizing Provider  acetaminophen  (TYLENOL ) 650 MG CR tablet Take 650 mg by mouth daily as needed for pain.   Yes [provider]  amLODipine  (NORVASC ) 10 MG tablet TAKE ONE TABLET BY MOUTH ONCE DAILY 01/04/22  Yes Pietro Redell GORMAN, MD  atorvastatin (LIPITOR) 20 MG tablet Take 20 mg by mouth at bedtime.   Yes [provider]  calcium-vitamin D (OSCAL WITH D) 500-200 MG-UNIT per tablet Take 1 tablet by mouth at bedtime.   Yes [provider]  hydrochlorothiazide (HYDRODIURIL) 25 MG tablet Take 25 mg by mouth daily.   Yes [provider]  lisinopril (PRINIVIL,ZESTRIL) 10 MG tablet Take 10 mg by mouth daily.   Yes [provider]  metoprolol  succinate (TOPROL -XL) 25 MG 24 hr tablet TAKE ONE TABLET BY MOUTH DAILY Patient taking differently: Take 25 mg by mouth at bedtime. 01/04/22  Yes  Pietro Redell GORMAN, MD  Multiple Vitamins-Minerals (CENTRUM SILVER 50+WOMEN PO) Take 1 tablet by mouth daily.   Yes [provider]  Multiple Vitamins-Minerals (PRESERVISION AREDS 2 PO) Take 1 tablet by mouth in the morning and at bedtime.   Yes [provider]  oxyCODONE  (ROXICODONE ) 5 MG immediate release tablet Take 0.5 tablets (2.5 mg total) by mouth every 4 (four) hours as needed for severe pain (pain score 7-10). 07/17/24  Yes Zlatan Hornback N, MD  zolpidem (AMBIEN) 10 MG tablet Take 10 mg by mouth at bedtime.   Yes [provider]    Allergies: Codeine    Review of Systems  Constitutional:  Negative for chills and fever.  HENT:  Negative for ear pain and sore throat.   Eyes:  Negative for pain and visual disturbance.  Respiratory:  Negative for cough and shortness of breath.   Cardiovascular:  Negative for chest pain and palpitations.  Gastrointestinal:  Negative for abdominal pain and vomiting.  Genitourinary:  Negative for dysuria and hematuria.  Musculoskeletal:  Negative for arthralgias and back pain.  Skin:  Negative for color change and rash.  Neurological:  Negative for seizures and syncope.  All other systems reviewed and are negative.   Updated Vital Signs BP (!) 128/53   Pulse 71   Temp 98.5 F (36.9 C) (Oral)   Resp 15   SpO2 92%  Physical Exam Vitals and nursing note reviewed.  Constitutional:      General: She is not in acute distress.    Appearance: She is well-developed.  HENT:     Head: Normocephalic and atraumatic.  Eyes:     Conjunctiva/sclera: Conjunctivae normal.  Cardiovascular:     Rate and Rhythm: Normal rate and regular rhythm.     Heart sounds: No murmur heard. Pulmonary:     Effort: Pulmonary effort is normal. No respiratory distress.     Breath sounds: Normal breath sounds.  Abdominal:     Palpations: Abdomen is soft.     Tenderness: There is no abdominal tenderness.  Musculoskeletal:        General: No  swelling.     Cervical back: Neck supple.       Back:  Skin:    General: Skin is warm and dry.     Capillary Refill: Capillary refill takes less than 2 seconds.  Neurological:     Mental Status: She is alert.  Psychiatric:        Mood and Affect: Mood normal.     (all labs ordered are listed, but only abnormal results are displayed) Labs Reviewed  CBC WITH DIFFERENTIAL/PLATELET - Abnormal; Notable for the following components:      Result Value   Neutro Abs 8.5 (*)    All other components within normal limits  COMPREHENSIVE METABOLIC PANEL WITH GFR - Abnormal; Notable for the following components:   Potassium 3.2 (*)    Glucose, Bld 123 (*)    All other components within normal limits    EKG: EKG Interpretation Date/Time:  Tuesday July 17 2024 09:27:36 EDT Ventricular Rate:  77 PR Interval:    QRS Duration:  113 QT Interval:  386 QTC Calculation: 437 R Axis:   79  Text Interpretation: Borderline intraventricular conduction delay Borderline repolarization abnormality Confirmed by Simon Rea [phone]) on 07/17/2024 9:30:46 AM  Radiology: CT Lumbar Spine Wo Contrast Result Date: 07/17/2024 CLINICAL DATA:  Trauma EXAM: CT LUMBAR SPINE WITHOUT CONTRAST TECHNIQUE: Multidetector CT imaging of the lumbar spine was performed without intravenous contrast administration. Multiplanar CT image reconstructions were also generated. RADIATION DOSE REDUCTION: This exam was performed according to the departmental dose-optimization program which includes automated exposure control, adjustment of the mA and/or kV according to patient size and/or use of iterative reconstruction technique. COMPARISON:  None Available. FINDINGS: Segmentation: 5 lumbar type vertebrae. Alignment: Normal. Vertebrae: There is no compression fracture identified Paraspinal and other soft tissues: No significant abnormality Disc levels: L1-L2: Moderate degenerative disc disease. Mild facet arthropathy. No spinal stenosis  L2-L3: Moderate degenerative disc disease with slight retrolisthesis. Mild facet arthropathy. No spinal stenosis L3-L4: Moderate degenerative disc disease and mild facet arthropathy with facet enlargement. There is mild spinal stenosis L4-L5: There is ankylosis of the vertebral bodies and facet joints. No significant spinal stenosis or foraminal stenosis. L5-S1: There is moderate degenerative disc disease with ankylosis of the vertebral bodies. Mild facet arthropathy. No spinal stenosis or foraminal stenosis IMPRESSION: Degenerative changes. No fracture Electronically Signed   By: Nancyann Burns M.D.   On: 07/17/2024 11:05   CT Cervical Spine Wo Contrast Result Date: 07/17/2024 CLINICAL DATA:  Trauma EXAM: CT CERVICAL SPINE WITHOUT CONTRAST TECHNIQUE: Multidetector CT imaging of the cervical spine was performed without intravenous contrast. Multiplanar CT image reconstructions were also generated. RADIATION DOSE REDUCTION: This exam was performed according to the departmental dose-optimization program which includes automated exposure control, adjustment of the mA and/or  kV according to patient size and/or use of iterative reconstruction technique. COMPARISON:  None Available. FINDINGS: There are degenerative changes with degenerative disc disease at C5-6 and C6-7 and multilevel facet arthropathy most notably on the right at C3-4, C4-5 and C5-6 The craniocervical junction is normal. The atlantoaxial articulation is normal. The dens is normal. There is no fracture identified. Other comments: None IMPRESSION: Degenerative changes, no fracture Electronically Signed   By: Nancyann Burns M.D.   On: 07/17/2024 11:01   CT Head Wo Contrast Result Date: 07/17/2024 CLINICAL DATA:  Trauma EXAM: CT HEAD WITHOUT CONTRAST TECHNIQUE: Contiguous axial images were obtained from the base of the skull through the vertex without intravenous contrast. RADIATION DOSE REDUCTION: This exam was performed according to the departmental  dose-optimization program which includes automated exposure control, adjustment of the mA and/or kV according to patient size and/or use of iterative reconstruction technique. COMPARISON:  None Available. FINDINGS: CT HEAD: Attenuation in the brain parenchyma is normal. There is no hemorrhage. No acute ischemic changes. No mass lesion. The ventricles are normal. Skull/sinuses/orbits: No significant abnormality. IMPRESSION: Normal Electronically Signed   By: Nancyann Burns M.D.   On: 07/17/2024 10:59     Procedures   Medications Ordered in the ED  oxyCODONE -acetaminophen  (PERCOCET/ROXICET) 5-325 MG per tablet 1 tablet (1 tablet Oral Given 07/17/24 0924)  potassium chloride  SA (KLOR-CON  M) CR tablet 40 mEq (40 mEq Oral Given 07/17/24 1134)  oxyCODONE -acetaminophen  (PERCOCET/ROXICET) 5-325 MG per tablet 1 tablet (1 tablet Oral Given 07/17/24 1134)  ondansetron  (ZOFRAN ) tablet 4 mg (4 mg Oral Given 07/17/24 1210)                                    Medical Decision Making Amount and/or Complexity of Data Reviewed Labs: ordered. Radiology: ordered.  Risk Prescription drug management.    Patient presents with fall.  Patient states that she initially fell on Sunday.  States that she was walking subsequently fell.  States today, has been having right lower back pain.  Patient states that she took some Tylenol  yesterday as well as a couple doses of ibuprofen.  Patient states that she woke up this morning and could not get up out of bed because of the pain.  No bowel bladder incontinence.  No saddle anesthesia.  No leg numbness.  No weakness.  She does endorse this pain.  No chest pain or shortness of breath.  No nausea vomit diarrhea.  She may have hit her head on Sunday but she is not really sure.  Denies anticoagulation use.  No neck pain.  Otherwise, denies all complaints.   Upon exam, patient A and O x 3 GCS 15.  No focal deficits.  Cranials 2 through 12 intact.  Patient has pain to palpation of the  lumbar area.  Mostly sacroiliac.  No significant midline.  Event fall as well as head strike, did obtain head CT and CT C-spine.  Unremarkable.  No subdural epidural.  I also did obtain CT L-spine given location of the pain.  No fracture was seen.  No compression fracture.  Otherwise, patient is able to move her hips freely.  No concerns of any kind of hip fracture.  No indication for x-ray imaging.  Did give patient small dose of oxycodone  x 2.  Subsequently consulted physical therapy.  Initially concerned that patient was not able to get up and move around but she was  able to do so with physical therapy.  Physical therapy did recommend home health evaluation which I have placed.  Initially with plan was to admit the patient but given that she is able to walk around in a steady manner without any kind of fall risk, I do feel comfortable discharging the patient home.  Did order a walker for the patient at home.  Ordered fairly small 2.5 mg oxycodone  for breakthrough pain.  Otherwise, recommended typical over-the-counter pain medication.  No concerns any, surgical or pathology such as cauda equina.  No indication for MRI ridging.  No urinary retention.  No bowel or bladder incontinence.  No saddle anesthesia.  She is neurologically intact as well.    No large electrolyte derangements.  Potassium 3.2 replaced orally.  EKG sinus rhythm.  Otherwise, unremarkable labs.  I think the fall is likely mechanical in nature that initially occurred.  No concerns for CVA.    Final diagnoses:  Acute low back pain, unspecified back pain laterality, unspecified whether sciatica present  Fall, initial encounter  Hypokalemia    ED Discharge Orders          Ordered    oxyCODONE  (ROXICODONE ) 5 MG immediate release tablet  Every 4 hours PRN        07/17/24 1435    Walker rolling        07/17/24 1509               Simon Lavonia SAILOR, MD 07/17/24 1540

## 2024-07-17 NOTE — Discharge Instructions (Addendum)
 For pain, you can take 1000 mg of Tylenol  or 1 g of Tylenol  every 6-8 hours.  Do not exceed more than 4000 mg or 4 g in a 24-hour period.  You can also take ibuprofen 600 to 800 mg every 6-8 hours as well.  Do not take this high-dose ibuprofen for greater than a week.   Please use lidocaine patches as well.   You can take 2.5 mg of oxycodone  for breakthrough pain.   Please follow up with physical therapy.

## 2024-07-24 DIAGNOSIS — G4700 Insomnia, unspecified: Secondary | ICD-10-CM | POA: Diagnosis not present

## 2024-07-24 DIAGNOSIS — I10 Essential (primary) hypertension: Secondary | ICD-10-CM | POA: Diagnosis not present

## 2024-07-24 DIAGNOSIS — M62830 Muscle spasm of back: Secondary | ICD-10-CM | POA: Diagnosis not present

## 2024-07-24 DIAGNOSIS — W19XXXA Unspecified fall, initial encounter: Secondary | ICD-10-CM | POA: Diagnosis not present

## 2024-07-26 DIAGNOSIS — H353124 Nonexudative age-related macular degeneration, left eye, advanced atrophic with subfoveal involvement: Secondary | ICD-10-CM | POA: Diagnosis not present

## 2024-07-26 DIAGNOSIS — H35033 Hypertensive retinopathy, bilateral: Secondary | ICD-10-CM | POA: Diagnosis not present

## 2024-07-26 DIAGNOSIS — H353211 Exudative age-related macular degeneration, right eye, with active choroidal neovascularization: Secondary | ICD-10-CM | POA: Diagnosis not present

## 2024-07-26 DIAGNOSIS — H43813 Vitreous degeneration, bilateral: Secondary | ICD-10-CM | POA: Diagnosis not present

## 2024-08-16 ENCOUNTER — Ambulatory Visit: Payer: Self-pay | Admitting: Cardiology

## 2024-08-16 ENCOUNTER — Ambulatory Visit (HOSPITAL_COMMUNITY)
Admission: RE | Admit: 2024-08-16 | Discharge: 2024-08-16 | Disposition: A | Discharge status: Home / Self Care | Source: Ambulatory Visit | Attending: Cardiology | Admitting: Cardiology

## 2024-08-16 DIAGNOSIS — I350 Nonrheumatic aortic (valve) stenosis: Secondary | ICD-10-CM | POA: Insufficient documentation

## 2024-08-16 LAB — ECHOCARDIOGRAM COMPLETE
AR max vel: 0.97 cm2
AV Area VTI: 1.11 cm2
AV Area mean vel: 1.01 cm2
AV Mean grad: 30 mmHg
AV Peak grad: 46.2 mmHg
Ao pk vel: 3.4 m/s
Area-P 1/2: 4.64 cm2
P 1/2 time: 341 ms
S' Lateral: 2.5 cm

## 2024-08-17 ENCOUNTER — Other Ambulatory Visit: Payer: Self-pay | Admitting: Obstetrics and Gynecology

## 2024-08-17 DIAGNOSIS — Z1231 Encounter for screening mammogram for malignant neoplasm of breast: Secondary | ICD-10-CM

## 2024-08-23 ENCOUNTER — Ambulatory Visit: Attending: Cardiology | Admitting: Cardiology

## 2024-08-23 ENCOUNTER — Encounter: Payer: Self-pay | Admitting: Cardiology

## 2024-08-23 VITALS — BP 140/56 | HR 73 | Ht 65.0 in | Wt 157.0 lb

## 2024-08-23 DIAGNOSIS — I350 Nonrheumatic aortic (valve) stenosis: Secondary | ICD-10-CM

## 2024-08-23 DIAGNOSIS — I10 Essential (primary) hypertension: Secondary | ICD-10-CM

## 2024-08-23 DIAGNOSIS — E7800 Pure hypercholesterolemia, unspecified: Secondary | ICD-10-CM | POA: Diagnosis not present

## 2024-08-23 DIAGNOSIS — R002 Palpitations: Secondary | ICD-10-CM | POA: Diagnosis not present

## 2024-08-23 NOTE — Progress Notes (Signed)
 HPI: FU AS and palpitations; cardiac catheterization May 2012 showed normal LV function and no obstructive coronary disease; mild AS with AVA 1.2 cm2. Holter monitor July 2016 showed sinus rhythm with first-degree AV block, PACs, PVCs and brief PAT.  Echocardiogram repeated October 2025 and showed normal LV function, moderate to severe aortic stenosis with mean gradient 30 mmHg, aortic valve area 1.11 cm and dimensionless index 0.28, moderate aortic insufficiency.  Since last seen she denies dyspnea on exertion, orthopnea, PND, pedal edema, chest pain or syncope.  She walks routinely up to 45 minutes at a time with no symptoms.  Current Outpatient Medications  Medication Sig Dispense Refill   acetaminophen  (TYLENOL ) 650 MG CR tablet Take 650 mg by mouth daily as needed for pain.     amLODipine  (NORVASC ) 10 MG tablet TAKE ONE TABLET BY MOUTH ONCE DAILY 90 tablet 3   atorvastatin (LIPITOR) 20 MG tablet Take 20 mg by mouth at bedtime.     calcium-vitamin D (OSCAL WITH D) 500-200 MG-UNIT per tablet Take 1 tablet by mouth at bedtime.     hydrochlorothiazide (HYDRODIURIL) 25 MG tablet Take 25 mg by mouth daily.     lisinopril (PRINIVIL,ZESTRIL) 10 MG tablet Take 10 mg by mouth daily.     metoprolol  succinate (TOPROL -XL) 25 MG 24 hr tablet TAKE ONE TABLET BY MOUTH DAILY (Patient taking differently: Take 25 mg by mouth at bedtime.) 90 tablet 3   Multiple Vitamins-Minerals (PRESERVISION AREDS 2 PO) Take 1 tablet by mouth in the morning and at bedtime.     Multiple Vitamins-Minerals (CENTRUM SILVER 50+WOMEN PO) Take 1 tablet by mouth daily.     oxyCODONE  (ROXICODONE ) 5 MG immediate release tablet Take 0.5 tablets (2.5 mg total) by mouth every 4 (four) hours as needed for severe pain (pain score 7-10). 5 tablet 0   zolpidem (AMBIEN) 10 MG tablet Take 10 mg by mouth at bedtime.     No current facility-administered medications for this visit.     Past Medical History:  Diagnosis Date   Aortic  stenosis    Essential hypertension 05/30/2015   Hyperlipidemia    Hypertension    Irregular heartbeat 06/02/2015   Macular degeneration of both eyes    DRY, RIGHT EYE WORSE THAN LEFT   Palpitations 05/30/2015   PSVT (paroxysmal supraventricular tachycardia) 07/23/2015   PVC's (premature ventricular contractions) 07/23/2015    Past Surgical History:  Procedure Laterality Date   CHOLECYSTECTOMY  10 YRS AGO   COLONOSCOPY WITH PROPOFOL  N/A 05/07/2014   Procedure: COLONOSCOPY WITH PROPOFOL ;  Surgeon: Gladis MARLA Louder, MD;  Location: [facility];  Service: Endoscopy;  Laterality: N/A;   EYE SURGERY Bilateral LAST FEW YEARS   LENS REPLACEMENT  FOR CATARACTS   LAPAROSCOPY  YRS AGO   TONSILLECTOMY  AS CHILD    Social History   Socioeconomic History   Marital status: Married    Spouse name: Not on file   Number of children: 1   Years of education: Not on file   Highest education level: Not on file  Occupational History   Not on file  Tobacco Use   Smoking status: Former    Current packs/day: 0.00    Types: Cigarettes    Quit date: 11/08/1992    Years since quitting: 31.8   Smokeless tobacco: Never  Vaping Use   Vaping status: Never Used  Substance and Sexual Activity   Alcohol use: Yes    Alcohol/week: 0.0 standard drinks of alcohol  Comment: RARE   Drug use: No   Sexual activity: Not on file  Other Topics Concern   Not on file  Social History Narrative   Not on file   Social Drivers of Health   Financial Resource Strain: Not on file  Food Insecurity: No Food Insecurity (07/21/2022)   Hunger Vital Sign    Worried About Running Out of Food in the Last Year: Never true    Ran Out of Food in the Last Year: Never true  Transportation Needs: No Transportation Needs (07/21/2022)   PRAPARE - Administrator, Civil Service (Medical): No    Lack of Transportation (Non-Medical): No  Physical Activity: Not on file  Stress: Not on file  Social Connections: Not on file   Intimate Partner Violence: Not on file    Family History  Problem Relation Age of Onset   Cancer Mother    CAD Mother        CABG 45s   COPD Father    Breast cancer Neg Hx     ROS: no fevers or chills, productive cough, hemoptysis, dysphasia, odynophagia, melena, hematochezia, dysuria, hematuria, rash, seizure activity, orthopnea, PND, pedal edema, claudication. Remaining systems are negative.  Physical Exam: Well-developed well-nourished in no acute distress.  Skin is warm and dry.  HEENT is normal.  Neck is supple.  Chest is clear to auscultation with normal expansion.  Cardiovascular exam is regular rate and rhythm.  3/6 systolic murmur left sternal border. Abdominal exam nontender or distended. No masses palpated. Extremities show no edema. neuro grossly intact  A/P  1 aortic stenosis/aortic insufficiency-moderate to severe aortic stenosis and moderate aortic insufficiency on most recent echocardiogram.  However patient remains asymptomatic.  She can walk 45 minutes vigorously with her friends with no chest pain or dyspnea.  I explained that she will likely need TAVR in the near future.  She will contact us  with any symptoms.  Otherwise I will see her back in 6 months.  2 hyperlipidemia-continue statin.  3 hypertension-patient's blood pressure is controlled.  Continue present medical regimen.  4 palpitations-no recent symptoms.  Continue beta-blocker at present dose.  Redell Shallow, MD

## 2024-08-23 NOTE — Patient Instructions (Signed)

## 2024-08-29 DIAGNOSIS — I471 Supraventricular tachycardia, unspecified: Secondary | ICD-10-CM | POA: Diagnosis not present

## 2024-08-29 DIAGNOSIS — R7303 Prediabetes: Secondary | ICD-10-CM | POA: Diagnosis not present

## 2024-08-29 DIAGNOSIS — G4700 Insomnia, unspecified: Secondary | ICD-10-CM | POA: Diagnosis not present

## 2024-08-29 DIAGNOSIS — Z0000 Encounter for general adult medical examination without abnormal findings: Secondary | ICD-10-CM | POA: Diagnosis not present

## 2024-08-29 DIAGNOSIS — F411 Generalized anxiety disorder: Secondary | ICD-10-CM | POA: Diagnosis not present

## 2024-08-29 DIAGNOSIS — E7800 Pure hypercholesterolemia, unspecified: Secondary | ICD-10-CM | POA: Diagnosis not present

## 2024-08-29 DIAGNOSIS — I10 Essential (primary) hypertension: Secondary | ICD-10-CM | POA: Diagnosis not present

## 2024-08-29 DIAGNOSIS — I350 Nonrheumatic aortic (valve) stenosis: Secondary | ICD-10-CM | POA: Diagnosis not present

## 2024-08-29 DIAGNOSIS — H353211 Exudative age-related macular degeneration, right eye, with active choroidal neovascularization: Secondary | ICD-10-CM | POA: Diagnosis not present

## 2024-08-29 DIAGNOSIS — Z1331 Encounter for screening for depression: Secondary | ICD-10-CM | POA: Diagnosis not present

## 2024-08-30 ENCOUNTER — Other Ambulatory Visit: Payer: Self-pay | Admitting: *Deleted

## 2024-08-30 DIAGNOSIS — I350 Nonrheumatic aortic (valve) stenosis: Secondary | ICD-10-CM

## 2024-09-14 ENCOUNTER — Ambulatory Visit

## 2024-09-20 DIAGNOSIS — Z6826 Body mass index (BMI) 26.0-26.9, adult: Secondary | ICD-10-CM | POA: Diagnosis not present

## 2024-09-20 DIAGNOSIS — Z01419 Encounter for gynecological examination (general) (routine) without abnormal findings: Secondary | ICD-10-CM | POA: Diagnosis not present

## 2024-09-24 DIAGNOSIS — H43813 Vitreous degeneration, bilateral: Secondary | ICD-10-CM | POA: Diagnosis not present

## 2024-09-24 DIAGNOSIS — H353124 Nonexudative age-related macular degeneration, left eye, advanced atrophic with subfoveal involvement: Secondary | ICD-10-CM | POA: Diagnosis not present

## 2024-09-24 DIAGNOSIS — H353211 Exudative age-related macular degeneration, right eye, with active choroidal neovascularization: Secondary | ICD-10-CM | POA: Diagnosis not present

## 2024-09-24 DIAGNOSIS — H35033 Hypertensive retinopathy, bilateral: Secondary | ICD-10-CM | POA: Diagnosis not present

## 2024-09-28 ENCOUNTER — Other Ambulatory Visit: Payer: Self-pay | Admitting: Nurse Practitioner

## 2024-09-28 ENCOUNTER — Ambulatory Visit
Admission: RE | Admit: 2024-09-28 | Discharge: 2024-09-28 | Disposition: A | Discharge status: Home / Self Care | Source: Ambulatory Visit | Attending: Obstetrics and Gynecology | Admitting: Obstetrics and Gynecology

## 2024-09-28 DIAGNOSIS — Z1231 Encounter for screening mammogram for malignant neoplasm of breast: Secondary | ICD-10-CM | POA: Diagnosis not present

## 2024-10-01 DIAGNOSIS — Z85828 Personal history of other malignant neoplasm of skin: Secondary | ICD-10-CM | POA: Diagnosis not present

## 2024-10-01 DIAGNOSIS — L821 Other seborrheic keratosis: Secondary | ICD-10-CM | POA: Diagnosis not present

## 2024-10-01 DIAGNOSIS — L565 Disseminated superficial actinic porokeratosis (DSAP): Secondary | ICD-10-CM | POA: Diagnosis not present

## 2024-10-01 DIAGNOSIS — D1801 Hemangioma of skin and subcutaneous tissue: Secondary | ICD-10-CM | POA: Diagnosis not present

## 2024-10-01 DIAGNOSIS — C44622 Squamous cell carcinoma of skin of right upper limb, including shoulder: Secondary | ICD-10-CM | POA: Diagnosis not present

## 2024-10-01 DIAGNOSIS — L814 Other melanin hyperpigmentation: Secondary | ICD-10-CM | POA: Diagnosis not present

## 2024-10-01 DIAGNOSIS — L570 Actinic keratosis: Secondary | ICD-10-CM | POA: Diagnosis not present

## 2025-03-04 ENCOUNTER — Ambulatory Visit: Admitting: Cardiology
